# Patient Record
Sex: Female | Born: 1975 | Race: White | Hispanic: No | Marital: Single | Smoking: Never smoker
Health system: Southern US, Community
[De-identification: ages and names within clinical notes are randomized; demographics above are authoritative.]

## PROBLEM LIST (undated history)

## (undated) DIAGNOSIS — B019 Varicella without complication: Secondary | ICD-10-CM

## (undated) DIAGNOSIS — I10 Essential (primary) hypertension: Secondary | ICD-10-CM

## (undated) HISTORY — DX: Varicella without complication: B01.9

---
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
## 2011-07-20 LAB — HM PAP SMEAR

## 2012-08-10 ENCOUNTER — Other Ambulatory Visit: Payer: Self-pay | Admitting: Internal Medicine

## 2012-08-10 NOTE — Telephone Encounter (Signed)
Pt is needing refill on her Birth control. She uses [facility] on [address].

## 2012-08-11 NOTE — Telephone Encounter (Signed)
Yes, if taking regularly and not missed and no concern of pregnancy - ok to fill until appt.  Please document name of med

## 2012-08-11 NOTE — Telephone Encounter (Signed)
Called patient, she said that every thing was fine. Also, called pharmacy to see what med she is taking. Wanted to make sure you want to fill.

## 2012-08-11 NOTE — Telephone Encounter (Signed)
Confirm with pt - has she been taking regularly, missed any, any concerns regarding pregnancy?

## 2012-08-18 ENCOUNTER — Telehealth: Payer: Self-pay | Admitting: *Deleted

## 2012-08-19 NOTE — Telephone Encounter (Signed)
Opened in error

## 2012-10-19 ENCOUNTER — Other Ambulatory Visit (HOSPITAL_COMMUNITY)
Admission: RE | Admit: 2012-10-19 | Discharge: 2012-10-19 | Disposition: A | Discharge status: Home / Self Care | Payer: BC Managed Care – PPO | Source: Ambulatory Visit | Attending: Internal Medicine | Admitting: Internal Medicine

## 2012-10-19 ENCOUNTER — Encounter: Payer: Self-pay | Admitting: Internal Medicine

## 2012-10-19 ENCOUNTER — Telehealth: Payer: Self-pay | Admitting: Internal Medicine

## 2012-10-19 ENCOUNTER — Ambulatory Visit (INDEPENDENT_AMBULATORY_CARE_PROVIDER_SITE_OTHER): Payer: BC Managed Care – PPO | Admitting: Internal Medicine

## 2012-10-19 VITALS — BP 110/76 | HR 92 | Temp 98.8°F | Ht 61.75 in | Wt 128.5 lb

## 2012-10-19 DIAGNOSIS — Z0000 Encounter for general adult medical examination without abnormal findings: Secondary | ICD-10-CM

## 2012-10-19 DIAGNOSIS — Z01419 Encounter for gynecological examination (general) (routine) without abnormal findings: Secondary | ICD-10-CM | POA: Insufficient documentation

## 2012-10-19 DIAGNOSIS — Z139 Encounter for screening, unspecified: Secondary | ICD-10-CM

## 2012-10-19 DIAGNOSIS — E0789 Other specified disorders of thyroid: Secondary | ICD-10-CM

## 2012-10-19 DIAGNOSIS — Z1151 Encounter for screening for human papillomavirus (HPV): Secondary | ICD-10-CM | POA: Insufficient documentation

## 2012-10-19 LAB — CBC WITH DIFFERENTIAL/PLATELET
Basophils Relative: 0.7 % (ref 0.0–3.0)
Eosinophils Relative: 1.6 % (ref 0.0–5.0)
Hemoglobin: 13.3 g/dL (ref 12.0–15.0)
Lymphocytes Relative: 39.8 % (ref 12.0–46.0)
MCHC: 33.9 g/dL (ref 30.0–36.0)
Monocytes Relative: 7.7 % (ref 3.0–12.0)
Neutro Abs: 2.9 10*3/uL (ref 1.4–7.7)
Neutrophils Relative %: 50.2 % (ref 43.0–77.0)
RBC: 4.47 Mil/uL (ref 3.87–5.11)
WBC: 5.7 10*3/uL (ref 4.5–10.5)

## 2012-10-19 LAB — T3, FREE: T3, Free: 3.5 pg/mL (ref 2.3–4.2)

## 2012-10-19 LAB — T4, FREE: Free T4: 0.71 ng/dL (ref 0.60–1.60)

## 2012-10-19 MED ORDER — NORGESTIM-ETH ESTRAD TRIPHASIC 0.18/0.215/0.25 MG-35 MCG PO TABS: 1.0000 | ORAL_TABLET | Freq: Every day | ORAL | Status: DC

## 2012-10-19 NOTE — Telephone Encounter (Signed)
Pt notified of labs vial my chart

## 2012-10-20 ENCOUNTER — Ambulatory Visit: Payer: Self-pay | Admitting: Internal Medicine

## 2012-10-21 ENCOUNTER — Encounter: Payer: Self-pay | Admitting: Internal Medicine

## 2012-10-23 ENCOUNTER — Encounter: Payer: Self-pay | Admitting: Internal Medicine

## 2012-10-23 NOTE — Progress Notes (Signed)
  Subjective:    Patient ID: Stephanie Graves, female    DOB: 07/08/1976, 37 y.o.   MRN: [id number]  HPI 37 year old female who comes in for her physical exam.  States she is doing well.  No significant allergy symptoms.  Got her flu shot 07/27/12.  LMP 12/13.  Period varies.  Not sexually active currently.  Denies possibility of being pregnant.  She is off her OCPs.  Ran out.  Periods are regular when she takes the pills.  No nausea or vomiting.  No problems swallowing.  No vaginal symptoms.    Past Medical History  Diagnosis Date  . Chicken pox as a child    Current Outpatient Prescriptions on File Prior to Visit  Medication Sig Dispense Refill  . Norgestimate-Ethinyl Estradiol Triphasic (ORTHO TRI-CYCLEN, 28,) 0.18/0.215/0.25 MG-35 MCG tablet Take 1 tablet by mouth daily.  1 Package  12    Review of Systems Patient denies any headache, lightheadedness or dizziness.  No sinus or allergy symptoms.  No chest pain, tightness or palpitations.  No increased shortness of breath, cough or congestion.  No nausea or vomiting. No acid reflux.  No abdominal pain or cramping.  No bowel change, such as diarrhea, constipation, BRBPR or melana.  No urine change.  Periods as outlined.       Objective:   Physical Exam Filed Vitals:   10/19/12 0829  BP: 110/76  Pulse: 92  Temp: 98.8 F (74.65 C)   37 year old female in no acute distress.   HEENT:  Nares- clear.  Oropharynx - without lesions. NECK:  Supple.  Nontender.  No audible bruit. Fullness - thyroid.  Diffuse.   HEART:  Appears to be regular. LUNGS:  No crackles or wheezing audible.  Respirations even and unlabored.  RADIAL PULSE:  Equal bilaterally.    BREASTS:  No nipple discharge or nipple retraction present.  Could not appreciate any distinct nodules or axillary adenopathy.  ABDOMEN:  Soft, nontender.  Bowel sounds present and normal.  No audible abdominal bruit.  GU:  Normal external genitalia.  Vaginal vault without lesions.  Cervix  identified.  Pap performed. Could not appreciate any adnexal masses or tenderness.  EXTREMITIES:  No increased edema present.  DP pulses palpable and equal bilaterally.          Assessment & Plan:  THYROID FULLNESS.  Check thyroid function and thyroid ultrasound.  Further w/up pending results.    FATIGUE.  Did report some fatigue.  Overall doing well.  Check cbc, met c and tsh.   MENSTRUAL IRREGULARITY.  Off her OCPs.  Regular when she takes.  States she is not currently sexually active.  Will restart after her next period as directed.  Follow.    HEALTH MAINTENANCE.  Physical today.  Pelvic/pap today.  Schedule baseline mammogram.  Check cholesterol.

## 2012-10-26 ENCOUNTER — Ambulatory Visit: Payer: Self-pay | Admitting: Internal Medicine

## 2012-10-28 ENCOUNTER — Ambulatory Visit: Payer: Self-pay | Admitting: Internal Medicine

## 2012-10-31 ENCOUNTER — Encounter: Payer: Self-pay | Admitting: Internal Medicine

## 2012-11-01 ENCOUNTER — Encounter: Payer: Self-pay | Admitting: Internal Medicine

## 2012-11-01 ENCOUNTER — Telehealth: Payer: Self-pay | Admitting: General Practice

## 2012-11-01 NOTE — Telephone Encounter (Signed)
Pt was calling to see if any results came in for [place] and mammograms from last week? Please advise.

## 2012-11-01 NOTE — Telephone Encounter (Signed)
Called pt with results.  Please schedule her to return for a follow up breast exam in the next 4-6 weeks.  Thanks.

## 2012-11-02 NOTE — Telephone Encounter (Signed)
Appointment 2/28 @ 8:15 pt aware of appointment

## 2012-11-19 ENCOUNTER — Other Ambulatory Visit: Payer: Self-pay

## 2012-12-02 ENCOUNTER — Encounter: Payer: Self-pay | Admitting: Internal Medicine

## 2012-12-02 ENCOUNTER — Ambulatory Visit (INDEPENDENT_AMBULATORY_CARE_PROVIDER_SITE_OTHER): Payer: BC Managed Care – PPO | Admitting: Internal Medicine

## 2012-12-02 VITALS — BP 110/80 | HR 91 | Temp 98.5°F | Ht 61.75 in | Wt 127.8 lb

## 2012-12-02 DIAGNOSIS — R928 Other abnormal and inconclusive findings on diagnostic imaging of breast: Secondary | ICD-10-CM

## 2012-12-04 ENCOUNTER — Encounter: Payer: Self-pay | Admitting: Internal Medicine

## 2012-12-04 NOTE — Progress Notes (Signed)
  Subjective:    Patient ID: Stephanie Graves, female    DOB: April 23, 1976, 37 y.o.   MRN: [id number]  HPI 37 year old female who comes in today for a follow up breast exam.  States she is doing relatively well.  Got her flu shot 07/27/12.  Has had some cough recently.  Taking dayquil and nyquil.  Improved.  LMP 12/13.   Not sexually active currently.  Denies possibility of being pregnant.  Back on OCPs. Periods are regular when she takes the pills.  No nausea or vomiting.  No problems swallowing.  No vaginal symptoms.  Has not noticed any change in her breast.  No nipple discharge.   Past Medical History  Diagnosis Date  . Chicken pox as a child    Current Outpatient Prescriptions on File Prior to Visit  Medication Sig Dispense Refill  . ibuprofen (ADVIL,MOTRIN) 200 MG tablet Take 200 mg by mouth every 6 (six) hours as needed.      . Norgestimate-Ethinyl Estradiol Triphasic (ORTHO TRI-CYCLEN, 28,) 0.18/0.215/0.25 MG-35 MCG tablet Take 1 tablet by mouth daily.  1 Package  12   No current facility-administered medications on file prior to visit.    Review of Systems Patient denies any headache, lightheadedness or dizziness.  Some recent increased cough.  See above.  No chest pain, tightness or palpitations.  No increased shortness of breath.  No nausea or vomiting. No acid reflux.  No abdominal pain or cramping.  No bowel change, such as diarrhea, constipation, BRBPR or melana.  No urine change.  Periods as outlined.       Objective:   Physical Exam  Filed Vitals:   12/02/12 0812  BP: 110/80  Pulse: 91  Temp: 98.5 F (63.26 C)   37 year old female in no acute distress.   HEENT:  Nares- clear.  Oropharynx - without lesions. NECK:  Supple.  Nontender.  No audible bruit. Fullness - thyroid.  Diffuse.   HEART:  Appears to be regular. LUNGS:  No crackles or wheezing audible.  Respirations even and unlabored.  RADIAL PULSE:  Equal bilaterally.    BREASTS:  No nipple discharge or nipple  retraction present.  Could not appreciate any distinct nodules or axillary adenopathy.  ABDOMEN:  Soft, nontender.  Bowel sounds present and normal.  No audible abdominal bruit.   EXTREMITIES:  No increased edema present.  DP pulses palpable and equal bilaterally.          Assessment & Plan:  THYROID FULLNESS.  Had ultrasound.  See ultrasound report.  Referred to endocrinology.   ABNORMAL MAMMOGRAM.  See mammo report and ultrasound report.  Changes felt to be benign - BiRADS II.  Will repeat mammo in one year.  Pt comfortable with this plan.  Discussed referral and six month follow up.  Will repeat in one year.   HEALTH MAINTENANCE.  Physical last visit.  Had normal pap.  Mammogram as outlined.

## 2012-12-19 ENCOUNTER — Encounter: Payer: Self-pay | Admitting: Internal Medicine

## 2013-08-10 ENCOUNTER — Other Ambulatory Visit: Payer: Self-pay

## 2013-10-19 ENCOUNTER — Other Ambulatory Visit (HOSPITAL_COMMUNITY)
Admission: RE | Admit: 2013-10-19 | Discharge: 2013-10-19 | Disposition: A | Discharge status: Home / Self Care | Payer: BC Managed Care – PPO | Source: Ambulatory Visit | Attending: Internal Medicine | Admitting: Internal Medicine

## 2013-10-19 ENCOUNTER — Encounter: Payer: Self-pay | Admitting: Internal Medicine

## 2013-10-19 ENCOUNTER — Ambulatory Visit (INDEPENDENT_AMBULATORY_CARE_PROVIDER_SITE_OTHER): Payer: BC Managed Care – PPO | Admitting: Internal Medicine

## 2013-10-19 VITALS — BP 110/70 | HR 107 | Temp 98.6°F | Ht 61.75 in | Wt 131.5 lb

## 2013-10-19 DIAGNOSIS — E0789 Other specified disorders of thyroid: Secondary | ICD-10-CM

## 2013-10-19 DIAGNOSIS — Z1239 Encounter for other screening for malignant neoplasm of breast: Secondary | ICD-10-CM

## 2013-10-19 DIAGNOSIS — Z1151 Encounter for screening for human papillomavirus (HPV): Secondary | ICD-10-CM | POA: Insufficient documentation

## 2013-10-19 DIAGNOSIS — Z01419 Encounter for gynecological examination (general) (routine) without abnormal findings: Secondary | ICD-10-CM | POA: Insufficient documentation

## 2013-10-19 DIAGNOSIS — Z124 Encounter for screening for malignant neoplasm of cervix: Secondary | ICD-10-CM

## 2013-10-19 MED ORDER — NORGESTIM-ETH ESTRAD TRIPHASIC 0.18/0.215/0.25 MG-35 MCG PO TABS: 1.0000 | ORAL_TABLET | Freq: Every day | ORAL | Status: DC

## 2013-10-19 NOTE — Progress Notes (Signed)
  Subjective:    Patient ID: Stephanie GillisJessica Kinner, female    DOB: 03/04/1976, 38 y.o.   MRN: [id number]  HPI 38 year old female who comes in today for a complete physical exam.  States she is doing well.   LMP last week of December.   Not sexually active currently.  Denies possibility of being pregnant.  Taking OCPs regularly.  Periods are regular.  No nausea or vomiting.  No problems swallowing.  No vaginal symptoms.  Has not noticed any change in her breast.  No nipple discharge. Going to the gym.  Exercising.  No problems exercising.     Past Medical History  Diagnosis Date  . Chicken pox as a child    Current Outpatient Prescriptions on File Prior to Visit  Medication Sig Dispense Refill  . ibuprofen (ADVIL,MOTRIN) 200 MG tablet Take 200 mg by mouth every 6 (six) hours as needed.      . Norgestimate-Ethinyl Estradiol Triphasic (ORTHO TRI-CYCLEN, 28,) 0.18/0.215/0.25 MG-35 MCG tablet Take 1 tablet by mouth daily.  1 Package  12   No current facility-administered medications on file prior to visit.    Review of Systems Patient denies any headache, lightheadedness or dizziness.  No sinus or allergy symptoms.   No chest pain, tightness or palpitations.  No increased shortness of breath.  No nausea or vomiting. No acid reflux.  No abdominal pain or cramping.  No bowel change, such as diarrhea, constipation, BRBPR or melana.  No urine change.  Periods regular.        Objective:   Physical Exam  Filed Vitals:   10/19/13 0830  BP: 110/70  Pulse: 107  Temp: 98.6 F (37 C)   Pulse recheck:  73100  38 year old female in no acute distress.   HEENT:  Nares- clear.  Oropharynx - without lesions. NECK:  Supple.  Nontender.  No audible bruit.  HEART:  Appears to be regular. LUNGS:  No crackles or wheezing audible.  Respirations even and unlabored.  RADIAL PULSE:  Equal bilaterally.    BREASTS:  No nipple discharge or nipple retraction present.  Could not appreciate any distinct nodules or  axillary adenopathy.  ABDOMEN:  Soft, nontender.  Bowel sounds present and normal.  No audible abdominal bruit.  GU:  Normal external genitalia.  Vaginal vault without lesions.  Cervix identified.  Pap performed. Could not appreciate any adnexal masses or tenderness.   RECTAL:  Not performed.   EXTREMITIES:  No increased edema present.  DP pulses palpable and equal bilaterally.          Assessment & Plan:  THYROID FULLNESS.  Had ultrasound.  See ultrasound report.  Referred to endocrinology.  Stable.  No change.    ABNORMAL MAMMOGRAM.  See mammo report and ultrasound report.  Changes felt to be benign - BiRADS II.  Will repeat mammo this year.    HEALTH MAINTENANCE.  Physical today.  Pap today.  Mammogram as outlined.  Mammogram scheduled.    I spent 25 minutes with the patient and more than 50% of the time was spent in consultation regarding the above.

## 2013-10-19 NOTE — Progress Notes (Signed)
Pre-visit discussion using our clinic review tool. No additional management support is needed unless otherwise documented below in the visit note.  

## 2013-10-19 NOTE — Assessment & Plan Note (Signed)
Saw endocrinology.  Recommended f/u thyroid ultrasound in 1 year - due 7/15 with endo.  Can repeat tsh then.

## 2013-10-21 ENCOUNTER — Encounter: Payer: Self-pay | Admitting: Internal Medicine

## 2013-11-02 ENCOUNTER — Ambulatory Visit: Payer: Self-pay | Admitting: Internal Medicine

## 2013-11-02 LAB — HM MAMMOGRAPHY

## 2013-11-03 ENCOUNTER — Encounter: Payer: Self-pay | Admitting: Internal Medicine

## 2013-11-06 ENCOUNTER — Ambulatory Visit: Payer: Self-pay | Admitting: Internal Medicine

## 2013-11-06 LAB — HM MAMMOGRAPHY

## 2013-11-09 ENCOUNTER — Encounter: Payer: Self-pay | Admitting: Internal Medicine

## 2013-11-10 ENCOUNTER — Encounter: Payer: Self-pay | Admitting: *Deleted

## 2013-11-13 NOTE — Telephone Encounter (Signed)
Mailed unread message to pt, requested call back to schedule appt

## 2013-12-01 ENCOUNTER — Encounter: Payer: Self-pay | Admitting: Internal Medicine

## 2013-12-01 ENCOUNTER — Ambulatory Visit (INDEPENDENT_AMBULATORY_CARE_PROVIDER_SITE_OTHER): Payer: BC Managed Care – PPO | Admitting: Internal Medicine

## 2013-12-01 VITALS — BP 120/84 | HR 91 | Temp 98.5°F | Ht 61.75 in | Wt 132.5 lb

## 2013-12-01 DIAGNOSIS — R928 Other abnormal and inconclusive findings on diagnostic imaging of breast: Secondary | ICD-10-CM

## 2013-12-01 NOTE — Progress Notes (Signed)
  Subjective:    Patient ID: Stephanie Graves, female    DOB: 04/25/1976, 38 y.o.   MRN: [id number]  HPI 38 year old female who comes in today as a work in for a f/u breast exam.  States she is doing well.   LMP finishing now. Taking OCPs regularly.  Periods are regular.  No nausea or vomiting.  No problems swallowing.  No vaginal symptoms.  Has not noticed any change in her breast.  No nipple discharge. Going to the gym.  Exercising.  No problems exercising.     Past Medical History  Diagnosis Date  . Chicken pox as a child    Current Outpatient Prescriptions on File Prior to Visit  Medication Sig Dispense Refill  . ibuprofen (ADVIL,MOTRIN) 200 MG tablet Take 200 mg by mouth every 6 (six) hours as needed.      . Norgestimate-Ethinyl Estradiol Triphasic (ORTHO TRI-CYCLEN, 28,) 0.18/0.215/0.25 MG-35 MCG tablet Take 1 tablet by mouth daily.  1 Package  12   No current facility-administered medications on file prior to visit.    Review of Systems No sinus or allergy symptoms.   No increased shortness of breath.  No cough or congestion.  No nausea or vomiting. No acid reflux.  No abdominal pain or cramping.  No bowel change, such as diarrhea, constipation, BRBPR or melana.  No urine change.  Periods regular.        Objective:   Physical Exam  Filed Vitals:   12/01/13 1106  BP: 120/84  Pulse: 91  Temp: 98.5 F (4436.799 C)   38 year old female in no acute distress. NECK:  Supple.  Nontender.  No audible bruit.  HEART:  Appears to be regular. LUNGS:  No crackles or wheezing audible.  Respirations even and unlabored.  RADIAL PULSE:  Equal bilaterally.    BREASTS:  No nipple discharge or nipple retraction present.  Could not appreciate any distinct nodules or axillary adenopathy.  ABDOMEN:  Soft, nontender.  Bowel sounds present and normal.  No audible abdominal bruit.         Assessment & Plan:  THYROID FULLNESS.  Had ultrasound.  See ultrasound report.  Referred to endocrinology.   Stable.  No change.    ABNORMAL MAMMOGRAM.  See mammo report and ultrasound report.  Changes felt to be benign - BiRADS III.  Breast exam as outlined.  Plan for f/u left breast ultrasound in 6 months.  Pt comfortable with this plan.    HEALTH MAINTENANCE.  Physical last visit.  Pap at physical ok.  Mammogram as outlined.  Follow up breast ultrasound planned.

## 2013-12-02 ENCOUNTER — Encounter: Payer: Self-pay | Admitting: Internal Medicine

## 2013-12-18 ENCOUNTER — Encounter: Payer: Self-pay | Admitting: Internal Medicine

## 2013-12-20 ENCOUNTER — Encounter: Payer: Self-pay | Admitting: Internal Medicine

## 2014-05-01 DIAGNOSIS — E041 Nontoxic single thyroid nodule: Secondary | ICD-10-CM | POA: Insufficient documentation

## 2014-05-23 ENCOUNTER — Telehealth: Payer: Self-pay | Admitting: Internal Medicine

## 2014-05-23 NOTE — Telephone Encounter (Addendum)
[facility] Left breast Ultrasound follow up cyst.  9.3.15 @ 9:40  Left the patient a voice mail on her cell phone in reference to her appointment . 8.19.15

## 2014-05-25 NOTE — Telephone Encounter (Signed)
The patient is aware of her Ultrasound appointment with [facility] on 9.3.15 @ 9:00.

## 2014-06-07 ENCOUNTER — Ambulatory Visit: Payer: Self-pay | Admitting: Internal Medicine

## 2014-06-07 LAB — HM MAMMOGRAPHY

## 2014-06-08 ENCOUNTER — Encounter: Payer: Self-pay | Admitting: Internal Medicine

## 2014-06-08 DIAGNOSIS — R928 Other abnormal and inconclusive findings on diagnostic imaging of breast: Secondary | ICD-10-CM | POA: Insufficient documentation

## 2014-08-10 ENCOUNTER — Encounter: Payer: Self-pay | Admitting: Podiatry

## 2014-08-10 ENCOUNTER — Ambulatory Visit (INDEPENDENT_AMBULATORY_CARE_PROVIDER_SITE_OTHER): Payer: BC Managed Care – PPO | Admitting: Podiatry

## 2014-08-10 ENCOUNTER — Ambulatory Visit (INDEPENDENT_AMBULATORY_CARE_PROVIDER_SITE_OTHER): Payer: BC Managed Care – PPO

## 2014-08-10 VITALS — BP 125/84 | HR 91 | Resp 16 | Ht 62.0 in | Wt 122.0 lb

## 2014-08-10 DIAGNOSIS — B079 Viral wart, unspecified: Secondary | ICD-10-CM

## 2014-08-10 DIAGNOSIS — L923 Foreign body granuloma of the skin and subcutaneous tissue: Secondary | ICD-10-CM

## 2014-08-10 DIAGNOSIS — B078 Other viral warts: Secondary | ICD-10-CM

## 2014-08-10 NOTE — Progress Notes (Signed)
   Subjective:    Patient ID: Stephanie Graves, female    DOB: 1975/11/06, 38 y.o.   MRN: [id number]  HPI Comments: i have a spot on the bottom of my left foot. It can be painful depending on how much i walk. Its been like this for 1.5 month. Its remained the same. Pressure will bother it. i have done nothing for my foot.   Foot Pain      Review of Systems  All other systems reviewed and are negative.      Objective:   Physical Exam        Assessment & Plan:

## 2014-08-10 NOTE — Patient Instructions (Signed)

## 2014-08-10 NOTE — Progress Notes (Signed)
Subjective:     Patient ID: Stephanie Graves, female   DOB: 07-05-1976, 38 y.o.   MRN: [id number]  HPI patient states I have a painful spot underneath my left foot that is really bothering me and making it hard for me to walk. It's been present for several months and I'm not sure why it's there   Review of Systems  All other systems reviewed and are negative.      Objective:   Physical Exam  Constitutional: She is oriented to person, place, and time.  Cardiovascular: Intact distal pulses.   Musculoskeletal: Normal range of motion.  Neurological: She is oriented to person, place, and time.  Skin: Skin is warm.  Nursing note and vitals reviewed.  patient's found to have a lesion underneath the first metatarsal head left in the area of the previous fibular sesamoidectomy that I performed that upon debridement shows pinpoint bleeding and pain to lateral pressure. 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 I recommended excision of mass and explained the surgery to patient and she wants this done understanding it may recur or other complications that can occur. Patient was infiltrated with 60 mg Xylocaine with epinephrine today and after appropriate numbness we applied sterile prep and I then excised the lesion completely and sent in formalin for pathological evaluation. Applied sterile dressing and instructed on elevation and ibuprofen usage

## 2014-09-10 ENCOUNTER — Encounter: Payer: Self-pay | Admitting: Podiatry

## 2014-10-22 ENCOUNTER — Ambulatory Visit (INDEPENDENT_AMBULATORY_CARE_PROVIDER_SITE_OTHER): Payer: BC Managed Care – PPO | Admitting: Internal Medicine

## 2014-10-22 ENCOUNTER — Encounter: Payer: Self-pay | Admitting: Internal Medicine

## 2014-10-22 VITALS — BP 124/83 | HR 85 | Temp 97.9°F | Ht 61.75 in | Wt 121.5 lb

## 2014-10-22 DIAGNOSIS — R928 Other abnormal and inconclusive findings on diagnostic imaging of breast: Secondary | ICD-10-CM

## 2014-10-22 DIAGNOSIS — E0789 Other specified disorders of thyroid: Secondary | ICD-10-CM

## 2014-10-22 DIAGNOSIS — Z0000 Encounter for general adult medical examination without abnormal findings: Secondary | ICD-10-CM

## 2014-10-22 DIAGNOSIS — Z13220 Encounter for screening for lipoid disorders: Secondary | ICD-10-CM

## 2014-10-22 MED ORDER — NORGESTIM-ETH ESTRAD TRIPHASIC 0.18/0.215/0.25 MG-35 MCG PO TABS: 1.0000 | ORAL_TABLET | Freq: Every day | ORAL | Status: DC

## 2014-10-22 NOTE — Progress Notes (Signed)
Pre visit review using our clinic review tool, if applicable. No additional management support is needed unless otherwise documented below in the visit note. 

## 2014-10-26 ENCOUNTER — Encounter: Payer: Self-pay | Admitting: Internal Medicine

## 2014-10-26 NOTE — Progress Notes (Signed)
  Subjective:    Patient ID: Stephanie Graves, female    DOB: [birth year], 39 y.o.   MRN: [id number]  HPI 39 year old female who comes in today for her physical exam.   States she is doing well.   LMP a few weeks ago.  Due to start next week.  Taking OCPs regularly.  Periods are regular.  No nausea or vomiting.  No problems swallowing.  No vaginal symptoms.  Has not noticed any change in her breast.  No nipple discharge.  Due for f/u mammogram and ultrasound.  Increased stress with work.  Handling well.  She is now living with her parents.  Helping take care of them.      Past Medical History  Diagnosis Date  . Chicken pox as a child    Current Outpatient Prescriptions on File Prior to Visit  Medication Sig Dispense Refill  . ibuprofen (ADVIL,MOTRIN) 200 MG tablet Take 200 mg by mouth every 6 (six) hours as needed.     No current facility-administered medications on file prior to visit.    Review of Systems No sinus or allergy symptoms.   No increased shortness of breath.  No cough or congestion.  No nausea or vomiting. No acid reflux.  No abdominal pain or cramping.  No bowel change, such as diarrhea, constipation, BRBPR or melana.  No urine change.  Periods regular.  Overalls he feels she is doing well.         Objective:   Physical Exam  Filed Vitals:   10/22/14 1537  BP: 124/83  Pulse: 85  Temp: 97.9 F (6236.436 C)   39 year old female in no acute distress.   HEENT:  Nares- clear.  Oropharynx - without lesions. NECK:  Supple.  Nontender.  No audible bruit.  HEART:  Appears to be regular. LUNGS:  No crackles or wheezing audible.  Respirations even and unlabored.  RADIAL PULSE:  Equal bilaterally.    BREASTS:  No nipple discharge or nipple retraction present.  Could not appreciate any distinct nodules or axillary adenopathy.  ABDOMEN:  Soft, nontender.  Bowel sounds present and normal.  No audible abdominal bruit.  GU:  Not performed.     EXTREMITIES:  No increased edema present.   DP pulses palpable and equal bilaterally.          Assessment & Plan:  Abnormal mammogram Breast exam as outlined.  Due bilateral mammogram and left breast ultrasound.   - MM Digital Diagnostic Bilat; Future - US BREAST COMPLETE UNI LEFT INC AXILLA; Future  Thyroid fullness Had thyroid ultrasound as outlined.   - CBC with Differential; Future - Comprehensive metabolic panel; Future - TSH; Future  Screening cholesterol level - Lipid panel; Future  THYROID FULLNESS.  Had ultrasound.  See ultrasound report.  Referred to endocrinology.  Stable.  No change.  Check tsh today.   ABNORMAL MAMMOGRAM.  See mammo report and ultrasound report.  Changes felt to be benign - BiRADS III.  Breast exam as outlined.  Left breast mammogram and ultrasound 06/07/14 - birads III. Due for bilateral mammogram and left breast ultrasound.    HEALTH MAINTENANCE.  Physical today.  Pap 10/19/13 - negative with negative HPV.  Mammogram as outlined.

## 2014-11-05 ENCOUNTER — Other Ambulatory Visit: Payer: Self-pay

## 2014-11-05 ENCOUNTER — Encounter: Payer: Self-pay | Admitting: Internal Medicine

## 2014-11-05 ENCOUNTER — Telehealth: Payer: Self-pay | Admitting: *Deleted

## 2014-11-05 ENCOUNTER — Ambulatory Visit: Payer: Self-pay | Admitting: Internal Medicine

## 2014-11-05 ENCOUNTER — Other Ambulatory Visit (INDEPENDENT_AMBULATORY_CARE_PROVIDER_SITE_OTHER): Payer: BC Managed Care – PPO

## 2014-11-05 DIAGNOSIS — E0789 Other specified disorders of thyroid: Secondary | ICD-10-CM

## 2014-11-05 DIAGNOSIS — R928 Other abnormal and inconclusive findings on diagnostic imaging of breast: Secondary | ICD-10-CM

## 2014-11-05 DIAGNOSIS — Z13220 Encounter for screening for lipoid disorders: Secondary | ICD-10-CM

## 2014-11-05 LAB — CBC WITH DIFFERENTIAL/PLATELET
BASOS PCT: 0.4 % (ref 0.0–3.0)
Basophils Absolute: 0 10*3/uL (ref 0.0–0.1)
Eosinophils Absolute: 0 10*3/uL (ref 0.0–0.7)
Eosinophils Relative: 0.6 % (ref 0.0–5.0)
HEMATOCRIT: 39.8 % (ref 36.0–46.0)
Hemoglobin: 13.7 g/dL (ref 12.0–15.0)
Lymphocytes Relative: 26.8 % (ref 12.0–46.0)
Lymphs Abs: 2 10*3/uL (ref 0.7–4.0)
MCHC: 34.4 g/dL (ref 30.0–36.0)
MCV: 86.3 fl (ref 78.0–100.0)
Monocytes Absolute: 0.4 10*3/uL (ref 0.1–1.0)
Monocytes Relative: 5.3 % (ref 3.0–12.0)
NEUTROS PCT: 66.9 % (ref 43.0–77.0)
Neutro Abs: 4.9 10*3/uL (ref 1.4–7.7)
Platelets: 310 10*3/uL (ref 150.0–400.0)
RBC: 4.6 Mil/uL (ref 3.87–5.11)
RDW: 12.9 % (ref 11.5–15.5)
WBC: 7.4 10*3/uL (ref 4.0–10.5)

## 2014-11-05 LAB — COMPREHENSIVE METABOLIC PANEL
ALT: 10 U/L (ref 0–35)
AST: 13 U/L (ref 0–37)
Albumin: 4 g/dL (ref 3.5–5.2)
Alkaline Phosphatase: 57 U/L (ref 39–117)
BUN: 18 mg/dL (ref 6–23)
CO2: 26 mEq/L (ref 19–32)
Calcium: 8.8 mg/dL (ref 8.4–10.5)
Chloride: 105 mEq/L (ref 96–112)
Creatinine, Ser: 0.88 mg/dL (ref 0.40–1.20)
GFR: 76.51 mL/min (ref >60.00)
GLUCOSE: 88 mg/dL (ref 70–99)
Potassium: 3.9 mEq/L (ref 3.5–5.1)
Sodium: 138 mEq/L (ref 135–145)
Total Bilirubin: 0.3 mg/dL (ref 0.2–1.2)
Total Protein: 6.7 g/dL (ref 6.0–8.3)

## 2014-11-05 LAB — LIPID PANEL
CHOL/HDL RATIO: 4
Cholesterol: 206 mg/dL — ABNORMAL HIGH (ref 0–200)
HDL: 55.5 mg/dL (ref >39.00)
LDL Cholesterol: 127 mg/dL — ABNORMAL HIGH (ref 0–99)
NonHDL: 150.5
Triglycerides: 116 mg/dL (ref 0.0–149.0)
VLDL: 23.2 mg/dL (ref 0.0–40.0)

## 2014-11-05 LAB — HM MAMMOGRAPHY

## 2014-11-05 LAB — TSH: TSH: 2.9 u[IU]/mL (ref 0.35–4.50)

## 2014-11-05 NOTE — Telephone Encounter (Signed)
[facility] states pt is present for Diagnostic Mammogram to f/u on Left Breast.  Requesting an order for a Right breast U/S as per requested by radiologist.  Requesting order be faxed to [phone], given verbal, need written as well.

## 2014-11-05 NOTE — Telephone Encounter (Signed)
Order faxed.

## 2014-11-05 NOTE — Telephone Encounter (Signed)
Ultrasound ordered

## 2014-11-06 ENCOUNTER — Encounter: Payer: Self-pay | Admitting: Internal Medicine

## 2015-10-24 ENCOUNTER — Ambulatory Visit (INDEPENDENT_AMBULATORY_CARE_PROVIDER_SITE_OTHER): Payer: BC Managed Care – PPO | Admitting: Internal Medicine

## 2015-10-24 ENCOUNTER — Encounter: Payer: Self-pay | Admitting: Internal Medicine

## 2015-10-24 VITALS — BP 110/80 | HR 89 | Temp 98.0°F | Resp 18 | Ht 61.75 in | Wt 130.5 lb

## 2015-10-24 DIAGNOSIS — R928 Other abnormal and inconclusive findings on diagnostic imaging of breast: Secondary | ICD-10-CM | POA: Diagnosis not present

## 2015-10-24 DIAGNOSIS — Z13220 Encounter for screening for lipoid disorders: Secondary | ICD-10-CM

## 2015-10-24 DIAGNOSIS — Z0000 Encounter for general adult medical examination without abnormal findings: Secondary | ICD-10-CM

## 2015-10-24 DIAGNOSIS — E0789 Other specified disorders of thyroid: Secondary | ICD-10-CM | POA: Diagnosis not present

## 2015-10-24 MED ORDER — NORGESTIM-ETH ESTRAD TRIPHASIC 0.18/0.215/0.25 MG-35 MCG PO TABS: 1.0000 | ORAL_TABLET | Freq: Every day | ORAL | Status: DC

## 2015-10-24 NOTE — Progress Notes (Signed)
Patient ID: Dai Apel, female   DOB: 1976-09-03, 40 y.o.   MRN: [id number]   Subjective:    Patient ID: Danyah Guastella, female    DOB: Feb 17, 1976, 40 y.o.   MRN: [id number]  HPI  Patient her for her physical exam.  She feels she is doing well.  Stays active.  No cardiac symptoms with increased activity or exertion.  No sob.  No acid reflux.  Started running.  Exercising.  LMP last week.  Regular periods.  No abdominal pain or cramping.  Bowels stable.     Past Medical History  Diagnosis Date  . Chicken pox as a child   Past Surgical History  Procedure Laterality Date  . Foot surgery  1995    bone removed from left foot   Family History  Problem Relation Age of Onset  . Hypertension Mother   . Hypercholesterolemia Mother   . Stroke Maternal Grandfather   . Brain cancer Paternal Grandfather   . Breast cancer Neg Hx   . Colon cancer Neg Hx    Social History   Social History  . Marital Status: Single    Spouse Name: N/A  . Number of Children: 0  . Years of Education: N/A   Social History Main Topics  . Smoking status: Never Smoker   . Smokeless tobacco: Never Used  . Alcohol Use: 0.0 oz/week    0 Standard drinks or equivalent per week     Comment: occasional  . Drug Use: No  . Sexual Activity: Not Asked   Other Topics Concern  . None   Social History Narrative    Outpatient Encounter Prescriptions as of 10/24/2015  Medication Sig  . ibuprofen (ADVIL,MOTRIN) 200 MG tablet Take 200 mg by mouth every 6 (six) hours as needed.  . Norgestimate-Ethinyl Estradiol Triphasic (ORTHO TRI-CYCLEN, 28,) 0.18/0.215/0.25 MG-35 MCG tablet Take 1 tablet by mouth daily.  . [DISCONTINUED] Norgestimate-Ethinyl Estradiol Triphasic (ORTHO TRI-CYCLEN, 28,) 0.18/0.215/0.25 MG-35 MCG tablet Take 1 tablet by mouth daily.   No facility-administered encounter medications on file as of 10/24/2015.    Review of Systems  Constitutional: Negative for fever and appetite change.  HENT: Negative  for congestion and sinus pressure.   Eyes: Negative for pain and visual disturbance.  Respiratory: Negative for cough, chest tightness and shortness of breath.   Cardiovascular: Negative for chest pain, palpitations and leg swelling.  Gastrointestinal: Negative for nausea, vomiting, abdominal pain and diarrhea.  Genitourinary: Negative for dysuria and difficulty urinating.  Musculoskeletal: Negative for back pain and joint swelling.  Skin: Negative for color change and rash.  Neurological: Negative for dizziness, light-headedness and headaches.  Hematological: Negative for adenopathy. Does not bruise/bleed easily.  Psychiatric/Behavioral: Negative for dysphoric mood and agitation.       Objective:    Physical Exam  Constitutional: She is oriented to person, place, and time. She appears well-developed and well-nourished. No distress.  HENT:  Nose: Nose normal.  Mouth/Throat: Oropharynx is clear and moist.  Eyes: Right eye exhibits no discharge. Left eye exhibits no discharge. No scleral icterus.  Neck: Neck supple. No thyromegaly present.  Cardiovascular: Normal rate and regular rhythm.   Pulmonary/Chest: Breath sounds normal. No accessory muscle usage. No tachypnea. No respiratory distress. She has no decreased breath sounds. She has no wheezes. She has no rhonchi. Right breast exhibits no inverted nipple, no mass, no nipple discharge and no tenderness (no axillary adenopathy). Left breast exhibits no inverted nipple, no mass, no nipple discharge and no tenderness (no  axilarry adenopathy).  Abdominal: Soft. Bowel sounds are normal. There is no tenderness.  Musculoskeletal: She exhibits no edema or tenderness.  Lymphadenopathy:    She has no cervical adenopathy.  Neurological: She is alert and oriented to person, place, and time.  Skin: Skin is warm. No rash noted. No erythema.  Psychiatric: She has a normal mood and affect. Her behavior is normal.    BP 110/80 mmHg  Pulse 89   Temp(Src) 98 F (36.7 C) (Oral)  Resp 18  Ht 5' 1.75" (1.568 m)  Wt 130 lb 8 oz (59.194 kg)  BMI 24.08 kg/m2  SpO2 96%  LMP 10/15/2015 (Exact Date) Wt Readings from Last 3 Encounters:  10/24/15 130 lb 8 oz (59.194 kg)  10/22/14 121 lb 8 oz (55.112 kg)  08/10/14 122 lb (55.339 kg)     Lab Results  Component Value Date   WBC 7.4 11/05/2014   HGB 13.7 11/05/2014   HCT 39.8 11/05/2014   PLT 310.0 11/05/2014   GLUCOSE 88 11/05/2014   CHOL 206* 11/05/2014   TRIG 116.0 11/05/2014   HDL 55.50 11/05/2014   LDLCALC 127* 11/05/2014   ALT 10 11/05/2014   AST 13 11/05/2014   NA 138 11/05/2014   K 3.9 11/05/2014   CL 105 11/05/2014   CREATININE 0.88 11/05/2014   BUN 18 11/05/2014   CO2 26 11/05/2014   TSH 2.90 11/05/2014       Assessment & Plan:   Problem List Items Addressed This Visit    Abnormal mammogram    Diagnostic mammogram 11/05/14 - Birads III.  Recommended f/u in 05/2015.  Pt was aware,but did not have mammogram.  Will schedule.        Relevant Orders   MM Digital Diagnostic Bilat   US BREAST LTD UNI LEFT INC AXILLA   US BREAST LTD UNI RIGHT INC AXILLA   Health care maintenance    Physical today 10/25/15.  PAP 2015 - negative with negative HPV.        Thyroid fullness    Saw endocrinology.  They were following.  Check tsh.        Relevant Orders   CBC with Differential/Platelet   Comprehensive metabolic panel   TSH    Other Visit Diagnoses    Routine general medical examination at a health care facility    -  Primary    Screening cholesterol level        Relevant Orders    Lipid panel        Dale Pillager, MD

## 2015-10-24 NOTE — Progress Notes (Signed)
Pre-visit discussion using our clinic review tool. No additional management support is needed unless otherwise documented below in the visit note.  

## 2015-10-27 ENCOUNTER — Encounter: Payer: Self-pay | Admitting: Internal Medicine

## 2015-10-27 DIAGNOSIS — Z0000 Encounter for general adult medical examination without abnormal findings: Secondary | ICD-10-CM | POA: Insufficient documentation

## 2015-10-27 NOTE — Assessment & Plan Note (Signed)
Physical today 10/25/15.  PAP 2015 - negative with negative HPV.

## 2015-10-27 NOTE — Assessment & Plan Note (Signed)
Diagnostic mammogram 11/05/14 - [facility].  Recommended f/u in 05/2015.  Pt was aware,but did not have mammogram.  Will schedule.

## 2015-10-27 NOTE — Assessment & Plan Note (Signed)
Saw endocrinology.  They were following.  Check tsh.

## 2015-11-07 ENCOUNTER — Other Ambulatory Visit (INDEPENDENT_AMBULATORY_CARE_PROVIDER_SITE_OTHER): Payer: BC Managed Care – PPO

## 2015-11-07 DIAGNOSIS — E0789 Other specified disorders of thyroid: Secondary | ICD-10-CM

## 2015-11-07 DIAGNOSIS — Z13220 Encounter for screening for lipoid disorders: Secondary | ICD-10-CM

## 2015-11-07 LAB — COMPREHENSIVE METABOLIC PANEL
ALBUMIN: 4 g/dL (ref 3.5–5.2)
ALT: 10 U/L (ref 0–35)
AST: 11 U/L (ref 0–37)
Alkaline Phosphatase: 51 U/L (ref 39–117)
BUN: 15 mg/dL (ref 6–23)
CHLORIDE: 105 meq/L (ref 96–112)
CO2: 26 mEq/L (ref 19–32)
Calcium: 9 mg/dL (ref 8.4–10.5)
Creatinine, Ser: 0.78 mg/dL (ref 0.40–1.20)
GFR: 87.48 mL/min (ref >60.00)
Glucose, Bld: 97 mg/dL (ref 70–99)
POTASSIUM: 4 meq/L (ref 3.5–5.1)
SODIUM: 138 meq/L (ref 135–145)
Total Bilirubin: 0.3 mg/dL (ref 0.2–1.2)
Total Protein: 7.1 g/dL (ref 6.0–8.3)

## 2015-11-07 LAB — CBC WITH DIFFERENTIAL/PLATELET
BASOS PCT: 0.3 % (ref 0.0–3.0)
Basophils Absolute: 0 10*3/uL (ref 0.0–0.1)
EOS PCT: 0.4 % (ref 0.0–5.0)
Eosinophils Absolute: 0 10*3/uL (ref 0.0–0.7)
HCT: 39.5 % (ref 36.0–46.0)
HEMOGLOBIN: 13.4 g/dL (ref 12.0–15.0)
LYMPHS ABS: 2.2 10*3/uL (ref 0.7–4.0)
LYMPHS PCT: 24.1 % (ref 12.0–46.0)
MCHC: 34 g/dL (ref 30.0–36.0)
MCV: 87.9 fl (ref 78.0–100.0)
MONO ABS: 0.5 10*3/uL (ref 0.1–1.0)
MONOS PCT: 5 % (ref 3.0–12.0)
NEUTROS ABS: 6.5 10*3/uL (ref 1.4–7.7)
NEUTROS PCT: 70.2 % (ref 43.0–77.0)
PLATELETS: 323 10*3/uL (ref 150.0–400.0)
RBC: 4.49 Mil/uL (ref 3.87–5.11)
RDW: 13.1 % (ref 11.5–15.5)
WBC: 9.2 10*3/uL (ref 4.0–10.5)

## 2015-11-07 LAB — LIPID PANEL
CHOL/HDL RATIO: 3
Cholesterol: 179 mg/dL (ref 0–200)
HDL: 61 mg/dL (ref >39.00)
LDL Cholesterol: 98 mg/dL (ref 0–99)
NONHDL: 118.22
Triglycerides: 103 mg/dL (ref 0.0–149.0)
VLDL: 20.6 mg/dL (ref 0.0–40.0)

## 2015-11-07 LAB — TSH: TSH: 1.82 u[IU]/mL (ref 0.35–4.50)

## 2015-11-08 ENCOUNTER — Encounter: Payer: Self-pay | Admitting: Internal Medicine

## 2015-11-12 ENCOUNTER — Encounter: Payer: Self-pay | Admitting: Internal Medicine

## 2015-11-12 ENCOUNTER — Ambulatory Visit
Admission: RE | Admit: 2015-11-12 | Discharge: 2015-11-12 | Disposition: A | Discharge status: Home / Self Care | Payer: BC Managed Care – PPO | Source: Ambulatory Visit | Attending: Internal Medicine | Admitting: Internal Medicine

## 2015-11-12 ENCOUNTER — Other Ambulatory Visit: Payer: Self-pay | Admitting: Internal Medicine

## 2015-11-12 DIAGNOSIS — R928 Other abnormal and inconclusive findings on diagnostic imaging of breast: Secondary | ICD-10-CM | POA: Insufficient documentation

## 2015-11-12 DIAGNOSIS — N63 Unspecified lump in breast: Secondary | ICD-10-CM | POA: Diagnosis not present

## 2015-11-12 DIAGNOSIS — N6002 Solitary cyst of left breast: Secondary | ICD-10-CM | POA: Diagnosis not present

## 2015-11-12 DIAGNOSIS — N6001 Solitary cyst of right breast: Secondary | ICD-10-CM | POA: Insufficient documentation

## 2015-11-12 NOTE — Telephone Encounter (Signed)
Unread mychart message mailed to patient 

## 2016-04-08 DIAGNOSIS — H5213 Myopia, bilateral: Secondary | ICD-10-CM | POA: Diagnosis not present

## 2016-04-27 ENCOUNTER — Ambulatory Visit: Payer: Self-pay | Admitting: Internal Medicine

## 2016-05-05 ENCOUNTER — Ambulatory Visit (INDEPENDENT_AMBULATORY_CARE_PROVIDER_SITE_OTHER): Payer: BLUE CROSS/BLUE SHIELD | Admitting: Internal Medicine

## 2016-05-05 ENCOUNTER — Encounter: Payer: Self-pay | Admitting: Internal Medicine

## 2016-05-05 DIAGNOSIS — E0789 Other specified disorders of thyroid: Secondary | ICD-10-CM

## 2016-05-05 DIAGNOSIS — Z658 Other specified problems related to psychosocial circumstances: Secondary | ICD-10-CM

## 2016-05-05 DIAGNOSIS — R928 Other abnormal and inconclusive findings on diagnostic imaging of breast: Secondary | ICD-10-CM | POA: Diagnosis not present

## 2016-05-05 DIAGNOSIS — F439 Reaction to severe stress, unspecified: Secondary | ICD-10-CM

## 2016-05-05 NOTE — Progress Notes (Signed)
Pre-visit discussion using our clinic review tool. No additional management support is needed unless otherwise documented below in the visit note.  

## 2016-05-05 NOTE — Progress Notes (Signed)
Patient ID: Stephanie Graves, female   DOB: Apr 08, 1976, 40 y.o.   MRN: [id number]   Subjective:    Patient ID: Stephanie Graves, female    DOB: [birth year], 40 y.o.   MRN: [id number]  HPI  Patient here for a scheduled follow up.  She is under increased stress with her job.  Discussed with her today. Does not feel she needs anything more at this time.  Tries to stay active.  Not exercising.  Has been out of the routine with increased work hours.  No chest pain.  No sob.  No acid reflux.  No abdominal pain or cramping.  Bowels stable.     Past Medical History:  Diagnosis Date  . Chicken pox as a child   Past Surgical History:  Procedure Laterality Date  . FOOT SURGERY  1995   bone removed from left foot   Family History  Problem Relation Age of Onset  . Hypertension Mother   . Hypercholesterolemia Mother   . Stroke Maternal Grandfather   . Brain cancer Paternal Grandfather   . Breast cancer Neg Hx   . Colon cancer Neg Hx    Social History   Social History  . Marital status: Single    Spouse name: N/A  . Number of children: 0  . Years of education: N/A   Social History Main Topics  . Smoking status: Never Smoker  . Smokeless tobacco: Never Used  . Alcohol use 0.0 oz/week     Comment: occasional  . Drug use: No  . Sexual activity: Not Asked   Other Topics Concern  . None   Social History Narrative  . None    Outpatient Encounter Prescriptions as of 05/05/2016  Medication Sig  . ibuprofen (ADVIL,MOTRIN) 200 MG tablet Take 200 mg by mouth every 6 (six) hours as needed.  . Norgestimate-Ethinyl Estradiol Triphasic (ORTHO TRI-CYCLEN, 28,) 0.18/0.215/0.25 MG-35 MCG tablet Take 1 tablet by mouth daily.  . TRI-PREVIFEM 0.18/0.215/0.25 MG-35 MCG tablet TAKE 1 TABLET BY MOUTH DAILY.   No facility-administered encounter medications on file as of 05/05/2016.     Review of Systems  Constitutional: Negative for activity change and unexpected weight change.  HENT: Negative for  congestion and sinus pressure.   Respiratory: Negative for cough, chest tightness and shortness of breath.   Cardiovascular: Negative for chest pain, palpitations and leg swelling.  Gastrointestinal: Negative for abdominal pain, diarrhea, nausea and vomiting.  Genitourinary: Negative for difficulty urinating and dysuria.  Musculoskeletal: Negative for back pain and joint swelling.  Skin: Negative for color change and rash.  Neurological: Negative for dizziness, light-headedness and headaches.  Psychiatric/Behavioral: Negative for agitation and dysphoric mood.       Objective:     Blood pressure rechecked by me:  132/80  Physical Exam  Constitutional: She appears well-developed and well-nourished. No distress.  HENT:  Nose: Nose normal.  Mouth/Throat: Oropharynx is clear and moist.  Neck: Neck supple. No thyromegaly present.  Cardiovascular: Normal rate and regular rhythm.   Pulmonary/Chest: Breath sounds normal. No respiratory distress. She has no wheezes.  Abdominal: Soft. Bowel sounds are normal. There is no tenderness.  Musculoskeletal: She exhibits no edema or tenderness.  Lymphadenopathy:    She has no cervical adenopathy.  Skin: No rash noted. No erythema.  Psychiatric: She has a normal mood and affect. Her behavior is normal.    BP 140/80 (BP Location: Left Arm, Patient Position: Sitting, Cuff Size: Normal)   Pulse 98   Temp 98.3 F (36.8  C) (Oral)   Resp 17   Ht 5' 1.75" (1.568 m)   Wt 134 lb 6 oz (61 kg)   SpO2 98%   BMI 24.78 kg/m  Wt Readings from Last 3 Encounters:  05/05/16 134 lb 6 oz (61 kg)  10/24/15 130 lb 8 oz (59.2 kg)  10/22/14 121 lb 8 oz (55.1 kg)     Lab Results  Component Value Date   WBC 9.2 11/07/2015   HGB 13.4 11/07/2015   HCT 39.5 11/07/2015   PLT 323.0 11/07/2015   GLUCOSE 97 11/07/2015   CHOL 179 11/07/2015   TRIG 103.0 11/07/2015   HDL 61.00 11/07/2015   LDLCALC 98 11/07/2015   ALT 10 11/07/2015   AST 11 11/07/2015   NA 138  11/07/2015   K 4.0 11/07/2015   CL 105 11/07/2015   CREATININE 0.78 11/07/2015   BUN 15 11/07/2015   CO2 26 11/07/2015   TSH 1.82 11/07/2015    US Breast Ltd Uni Left Inc Axilla  Result Date: 11/12/2015 CLINICAL DATA:  If 40 year old patient for follow-up of probably benign cysts in both breasts. The patient is asymptomatic. EXAM: DIGITAL DIAGNOSTIC BILATERAL MAMMOGRAM WITH 3D TOMOSYNTHESIS WITH CAD ULTRASOUND BILATERAL BREAST COMPARISON:  Previous exam(s). ACR Breast Density Category b: There are scattered areas of fibroglandular density. FINDINGS: Stable circumscribed nodule in the 12 o'clock region of the right breast. No new or suspicious mass, suspicious microcalcification or distortion is identified in the right breast. In the left breast, a stable approximately 7 mm circumscribed nodule is seen in the 7 o'clock region of the left breast, with a second smaller circumscribed nodule in the same quadrant. No new or suspicious mass, distortion, or suspicious microcalcification is identified in the left breast. Mammographic images were processed with CAD. Targeted ultrasound is performed, showing a circumscribed minimally complicated cyst at 12 o'clock position 3 cm from the nipple in the right breast that measures 4 x 3 x 3 mm. There is no associated vascular flow. In the left breast at 7 o'clock position 6 cm from the nipple are 2 adjacent cysts measuring 8 x 4 x 4 mm and 7 x 3 x 5 mm respectively. No suspicious findings are seen on ultrasound of either breast. IMPRESSION: Stable appearance of benign cysts bilaterally. No evidence of malignancy in either breast. RECOMMENDATION: Screening mammogram at age 22 unless there are persistent or intervening clinical concerns. (Code:SM-B-40A) I have discussed the findings and recommendations with the patient. Results were also provided in writing at the conclusion of the visit. If applicable, a reminder letter will be sent to the patient regarding the next  appointment. BI-RADS CATEGORY  2: Benign. Electronically Signed   By: Britta Mccreedy M.D.   On: 11/12/2015 14:20   US Breast Ltd Uni Right Inc Axilla  Result Date: 11/12/2015 CLINICAL DATA:  If 40 year old patient for follow-up of probably benign cysts in both breasts. The patient is asymptomatic. EXAM: DIGITAL DIAGNOSTIC BILATERAL MAMMOGRAM WITH 3D TOMOSYNTHESIS WITH CAD ULTRASOUND BILATERAL BREAST COMPARISON:  Previous exam(s). ACR Breast Density Category b: There are scattered areas of fibroglandular density. FINDINGS: Stable circumscribed nodule in the 12 o'clock region of the right breast. No new or suspicious mass, suspicious microcalcification or distortion is identified in the right breast. In the left breast, a stable approximately 7 mm circumscribed nodule is seen in the 7 o'clock region of the left breast, with a second smaller circumscribed nodule in the same quadrant. No new or suspicious mass, distortion, or suspicious  microcalcification is identified in the left breast. Mammographic images were processed with CAD. Targeted ultrasound is performed, showing a circumscribed minimally complicated cyst at 12 o'clock position 3 cm from the nipple in the right breast that measures 4 x 3 x 3 mm. There is no associated vascular flow. In the left breast at 7 o'clock position 6 cm from the nipple are 2 adjacent cysts measuring 8 x 4 x 4 mm and 7 x 3 x 5 mm respectively. No suspicious findings are seen on ultrasound of either breast. IMPRESSION: Stable appearance of benign cysts bilaterally. No evidence of malignancy in either breast. RECOMMENDATION: Screening mammogram at age 31 unless there are persistent or intervening clinical concerns. (Code:[id number]-B-40A) I have discussed the findings and recommendations with the patient. Results were also provided in writing at the conclusion of the visit. If applicable, a reminder letter will be sent to the patient regarding the next appointment. BI-RADS CATEGORY  2: Benign.  Electronically Signed   By: Britta Mccreedy M.D.   On: 11/12/2015 14:20   Mm Diag Breast Tomo Bilateral  Result Date: 11/12/2015 CLINICAL DATA:  If 40 year old patient for follow-up of probably benign cysts in both breasts. The patient is asymptomatic. EXAM: DIGITAL DIAGNOSTIC BILATERAL MAMMOGRAM WITH 3D TOMOSYNTHESIS WITH CAD ULTRASOUND BILATERAL BREAST COMPARISON:  Previous exam(s). ACR Breast Density Category b: There are scattered areas of fibroglandular density. FINDINGS: Stable circumscribed nodule in the 12 o'clock region of the right breast. No new or suspicious mass, suspicious microcalcification or distortion is identified in the right breast. In the left breast, a stable approximately 7 mm circumscribed nodule is seen in the 7 o'clock region of the left breast, with a second smaller circumscribed nodule in the same quadrant. No new or suspicious mass, distortion, or suspicious microcalcification is identified in the left breast. Mammographic images were processed with CAD. Targeted ultrasound is performed, showing a circumscribed minimally complicated cyst at 12 o'clock position 3 cm from the nipple in the right breast that measures 4 x 3 x 3 mm. There is no associated vascular flow. In the left breast at 7 o'clock position 6 cm from the nipple are 2 adjacent cysts measuring 8 x 4 x 4 mm and 7 x 3 x 5 mm respectively. No suspicious findings are seen on ultrasound of either breast. IMPRESSION: Stable appearance of benign cysts bilaterally. No evidence of malignancy in either breast. RECOMMENDATION: Screening mammogram at age 22 unless there are persistent or intervening clinical concerns. (Code:SM-B-40A) I have discussed the findings and recommendations with the patient. Results were also provided in writing at the conclusion of the visit. If applicable, a reminder letter will be sent to the patient regarding the next appointment. BI-RADS CATEGORY  2: Benign. Electronically Signed   By: Britta Mccreedy M.D.    On: 11/12/2015 14:20       Assessment & Plan:   Problem List Items Addressed This Visit    Abnormal mammogram    Mammogram with ultrasound 11/12/15 - [facility] II.        Stress    Increased stress with her work.  Discussed with her today.  She desires no further intervention at this time.  Follow.        Thyroid fullness    Saw endocrinology.  Continue f/u with Dr Renae Fickle.  Follow tsh. Is due f/u appt now.  Last evaluated 04/2014.  Recommended f/u in 2 years.         Other Visit Diagnoses   None.  Einar Pheasant, MD

## 2016-05-10 ENCOUNTER — Encounter: Payer: Self-pay | Admitting: Internal Medicine

## 2016-05-10 DIAGNOSIS — F439 Reaction to severe stress, unspecified: Secondary | ICD-10-CM | POA: Insufficient documentation

## 2016-05-10 NOTE — Assessment & Plan Note (Signed)
Mammogram with ultrasound 11/12/15 - [facility].

## 2016-05-10 NOTE — Assessment & Plan Note (Signed)
Saw endocrinology.  Continue f/u with Dr Renae FicklePaul.  Follow tsh. Is due f/u appt now.  Last evaluated 04/2014.  Recommended f/u in 2 years.

## 2016-05-10 NOTE — Assessment & Plan Note (Signed)
Increased stress with her work.  Discussed with her today.  She desires no further intervention at this time.  Follow.

## 2016-08-15 IMAGING — MG MM DIAG BREAST TOMO BILATERAL
8 of 12 series · 8 of 28 positions shown · non-contrast
Comparison: Previous exam(s).

CLINICAL DATA: If 39-year-old patient for follow-up of probably
benign cysts in both breasts. The patient is asymptomatic.

EXAM:
DIGITAL DIAGNOSTIC BILATERAL MAMMOGRAM WITH 3D TOMOSYNTHESIS WITH
CAD
ULTRASOUND BILATERAL BREAST

[L MLO synth-2D]
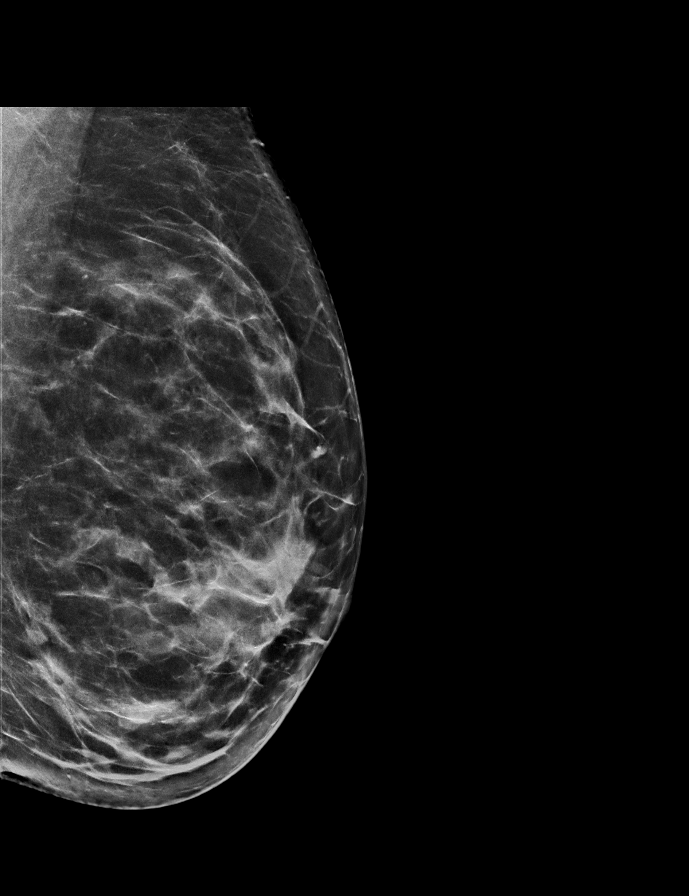

[R MLO synth-2D]
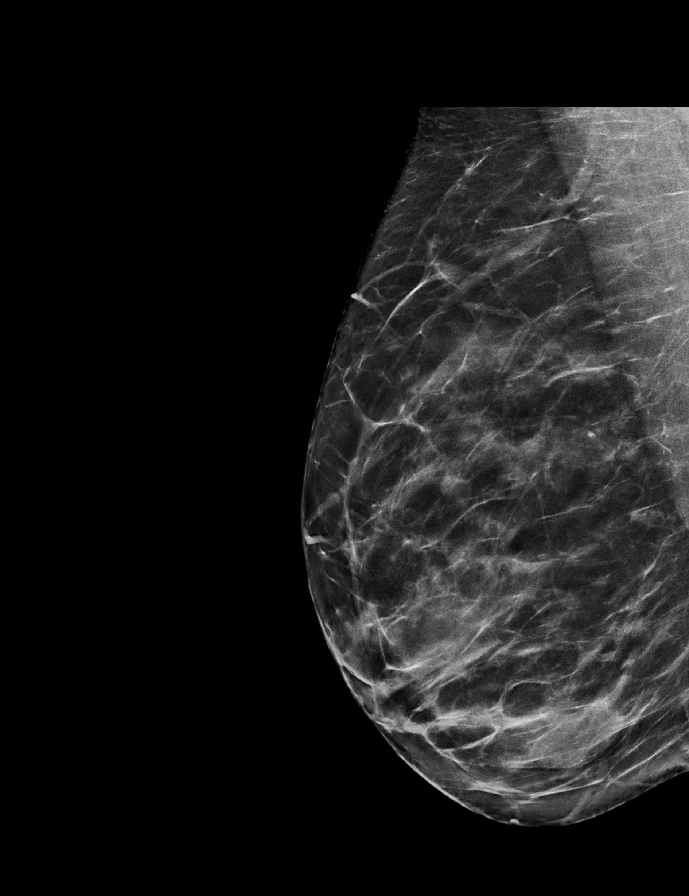

[L CC]
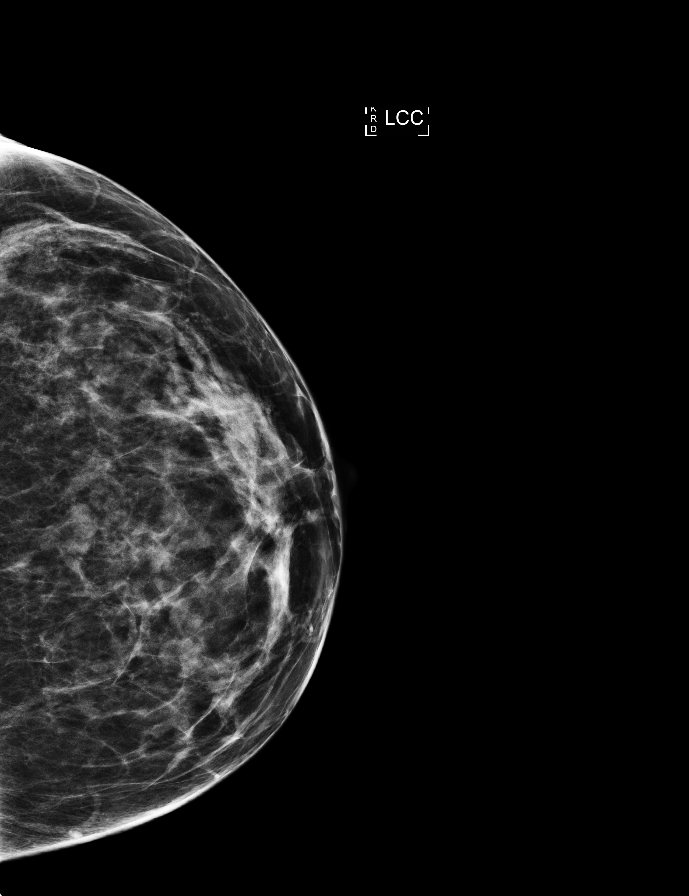

[R MLO]
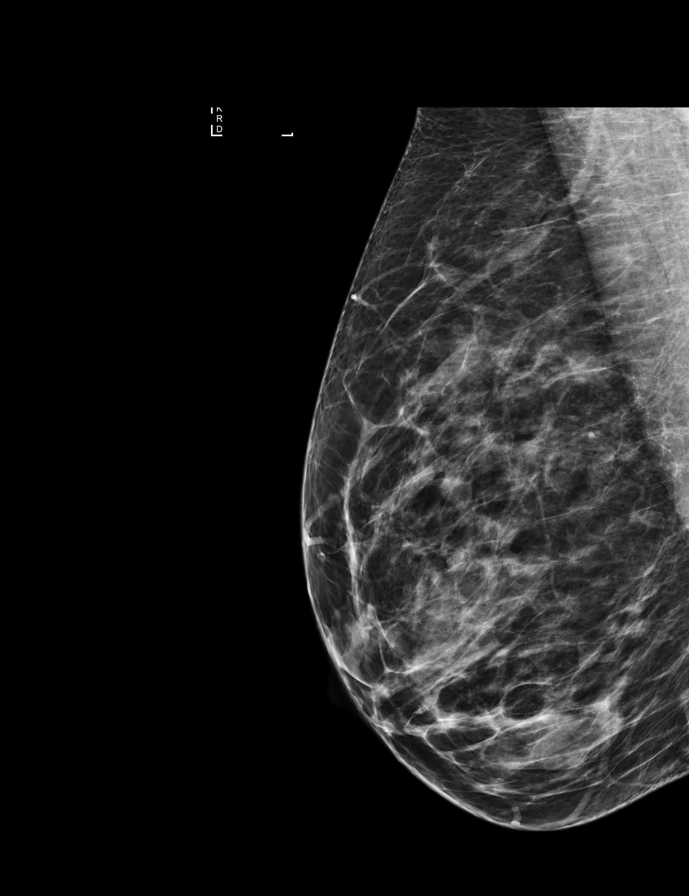

[L MLO]
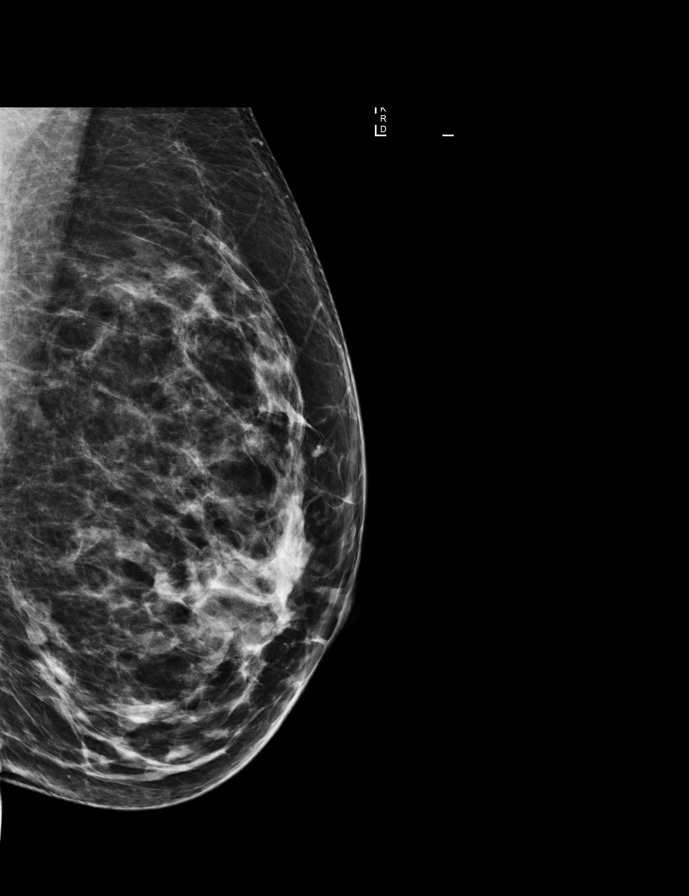

[R CC synth-2D]
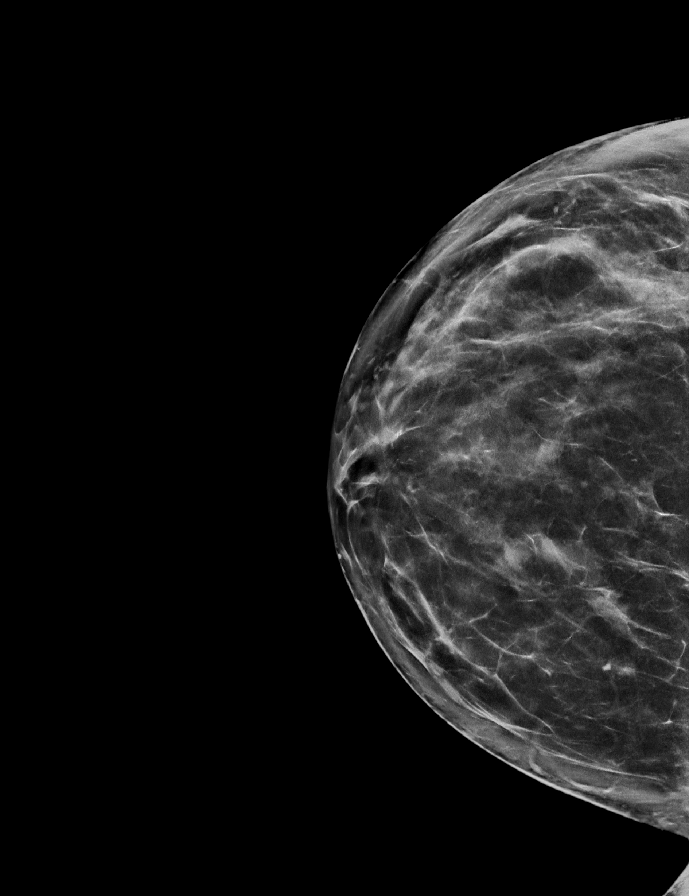

[L CC synth-2D]
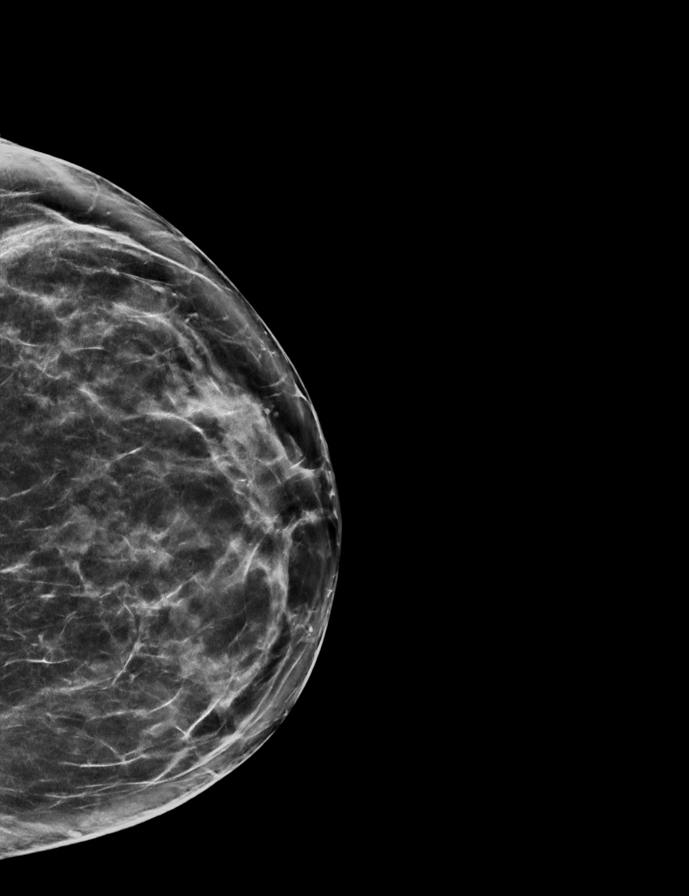

[R CC]
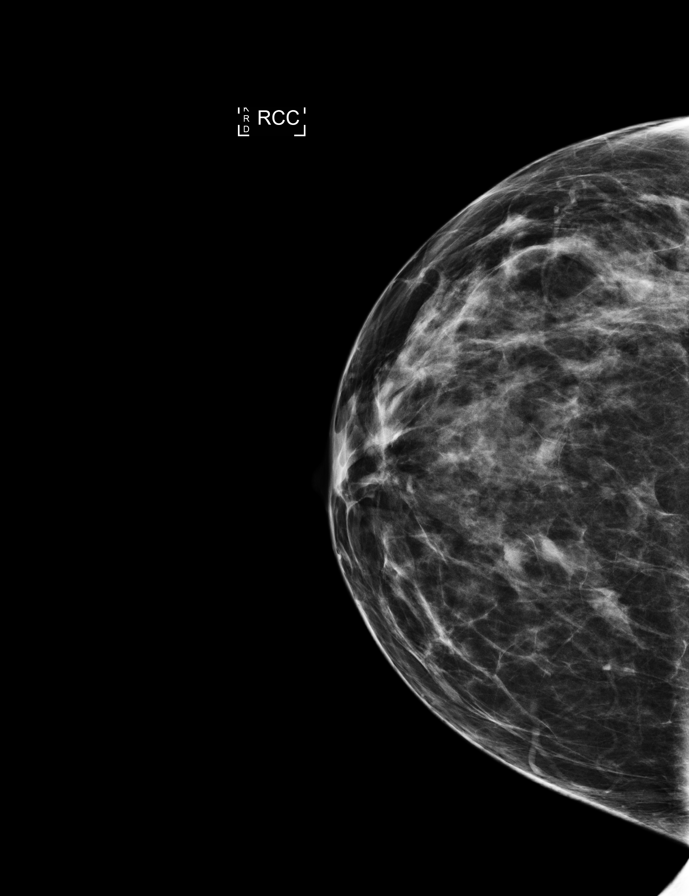

[8 of 28 positions shown; findings below may reference images not displayed]

ACR Breast Density Category b: There are scattered areas of
fibroglandular density.
FINDINGS: Stable circumscribed nodule in the 12 o'clock region of the right
breast. No new or suspicious mass, suspicious microcalcification or
distortion is identified in the right breast.

In the left breast, a stable approximately 7 mm circumscribed nodule
is seen in the 7 o'clock region of the left breast, with a second
smaller circumscribed nodule in the same quadrant. No new or
suspicious mass, distortion, or suspicious microcalcification is
identified in the left breast.

Mammographic images were processed with CAD.

Targeted ultrasound is performed, showing a circumscribed minimally
complicated cyst at 12 o'clock position 3 cm from the nipple in the
right breast that measures 4 x 3 x 3 mm. There is no associated
vascular flow.

In the left breast at 7 o'clock position 6 cm from the nipple are 2
adjacent cysts measuring 8 x 4 x 4 mm and 7 x 3 x 5 mm respectively.

No suspicious findings are seen on ultrasound of either breast.
IMPRESSION: Stable appearance of benign cysts bilaterally. No evidence of
malignancy in either breast.

RECOMMENDATION:
Screening mammogram at age 41 unless there are persistent or
intervening clinical concerns. (Code:[id number])

I have discussed the findings and recommendations with the patient.
Results were also provided in writing at the conclusion of the
visit. If applicable, a reminder letter will be sent to the patient
regarding the next appointment.

BI-RADS CATEGORY  2: Benign.

## 2016-10-19 ENCOUNTER — Other Ambulatory Visit: Payer: Self-pay | Admitting: Surgical

## 2016-10-19 MED ORDER — NORGESTIM-ETH ESTRAD TRIPHASIC 0.18/0.215/0.25 MG-35 MCG PO TABS: 1.0000 | ORAL_TABLET | Freq: Every day | ORAL | 12 refills | Status: DC

## 2016-12-28 ENCOUNTER — Encounter: Payer: Self-pay | Admitting: Internal Medicine

## 2016-12-31 ENCOUNTER — Encounter: Payer: Self-pay | Admitting: Internal Medicine

## 2016-12-31 ENCOUNTER — Ambulatory Visit (INDEPENDENT_AMBULATORY_CARE_PROVIDER_SITE_OTHER): Payer: BLUE CROSS/BLUE SHIELD | Admitting: Internal Medicine

## 2016-12-31 ENCOUNTER — Other Ambulatory Visit (HOSPITAL_COMMUNITY)
Admission: RE | Admit: 2016-12-31 | Discharge: 2016-12-31 | Disposition: A | Discharge status: Home / Self Care | Payer: BLUE CROSS/BLUE SHIELD | Source: Ambulatory Visit | Attending: Internal Medicine | Admitting: Internal Medicine

## 2016-12-31 VITALS — BP 138/76 | HR 100 | Temp 98.6°F | Resp 14 | Ht 61.81 in | Wt 141.6 lb

## 2016-12-31 DIAGNOSIS — Z01419 Encounter for gynecological examination (general) (routine) without abnormal findings: Secondary | ICD-10-CM | POA: Diagnosis not present

## 2016-12-31 DIAGNOSIS — Z1151 Encounter for screening for human papillomavirus (HPV): Secondary | ICD-10-CM | POA: Diagnosis not present

## 2016-12-31 DIAGNOSIS — F439 Reaction to severe stress, unspecified: Secondary | ICD-10-CM

## 2016-12-31 DIAGNOSIS — E0789 Other specified disorders of thyroid: Secondary | ICD-10-CM

## 2016-12-31 DIAGNOSIS — Z13220 Encounter for screening for lipoid disorders: Secondary | ICD-10-CM

## 2016-12-31 DIAGNOSIS — Z124 Encounter for screening for malignant neoplasm of cervix: Secondary | ICD-10-CM | POA: Diagnosis not present

## 2016-12-31 DIAGNOSIS — Z0000 Encounter for general adult medical examination without abnormal findings: Secondary | ICD-10-CM | POA: Diagnosis not present

## 2016-12-31 NOTE — Progress Notes (Signed)
Patient ID: Stephanie Graves, female   DOB: 04-21-1976, 41 y.o.   MRN: [id number]   Subjective:    Patient ID: Stephanie Graves, female    DOB: May 04, 1976, 41 y.o.   MRN: [id number]  HPI  Patient here for her physical exam.  She reports she is doing better.  Stress at work is better.  Hired a Radio broadcast assistant.  This has helped to relieve some of her stress.  Discussed diet and exercise.  She tries to stay active.  Breathing stable.  No acid reflux.  No abdominal pain or cramping.  Bowels stable.     Past Medical History:  Diagnosis Date  . Chicken pox as a child   Past Surgical History:  Procedure Laterality Date  . FOOT SURGERY  1995   bone removed from left foot   Family History  Problem Relation Age of Onset  . Hypertension Mother   . Hypercholesterolemia Mother   . Stroke Maternal Grandfather   . Brain cancer Paternal Grandfather   . Breast cancer Neg Hx   . Colon cancer Neg Hx    Social History   Social History  . Marital status: Single    Spouse name: N/A  . Number of children: 0  . Years of education: N/A   Social History Main Topics  . Smoking status: Never Smoker  . Smokeless tobacco: Never Used  . Alcohol use 0.0 oz/week     Comment: occasional  . Drug use: No  . Sexual activity: Not Asked   Other Topics Concern  . None   Social History Narrative  . None    Outpatient Encounter Prescriptions as of 12/31/2016  Medication Sig  . ibuprofen (ADVIL,MOTRIN) 200 MG tablet Take 200 mg by mouth every 6 (six) hours as needed.  . Norgestimate-Ethinyl Estradiol Triphasic (ORTHO TRI-CYCLEN, 28,) 0.18/0.215/0.25 MG-35 MCG tablet Take 1 tablet by mouth daily.  . [DISCONTINUED] Norgestimate-Ethinyl Estradiol Triphasic (TRI-PREVIFEM) 0.18/0.215/0.25 MG-35 MCG tablet Take 1 tablet by mouth daily.   No facility-administered encounter medications on file as of 12/31/2016.     Review of Systems  Constitutional: Negative for appetite change and unexpected weight change.  HENT:  Negative for congestion and sinus pressure.   Eyes: Negative for pain and visual disturbance.  Respiratory: Negative for cough, chest tightness and shortness of breath.   Cardiovascular: Negative for chest pain, palpitations and leg swelling.  Gastrointestinal: Negative for abdominal pain, diarrhea, nausea and vomiting.  Genitourinary: Negative for difficulty urinating and dysuria.  Musculoskeletal: Negative for back pain and joint swelling.  Skin: Negative for color change and rash.  Neurological: Negative for dizziness, light-headedness and headaches.  Hematological: Negative for adenopathy. Does not bruise/bleed easily.  Psychiatric/Behavioral: Negative for agitation and dysphoric mood.       Objective:     Blood pressure rechecked by me:  136/86-88  Physical Exam  Constitutional: She is oriented to person, place, and time. She appears well-developed and well-nourished. No distress.  HENT:  Nose: Nose normal.  Mouth/Throat: Oropharynx is clear and moist.  Eyes: Right eye exhibits no discharge. Left eye exhibits no discharge. No scleral icterus.  Neck: Neck supple. No thyromegaly present.  Cardiovascular: Normal rate and regular rhythm.   Pulmonary/Chest: Breath sounds normal. No accessory muscle usage. No tachypnea. No respiratory distress. She has no decreased breath sounds. She has no wheezes. She has no rhonchi. Right breast exhibits no inverted nipple, no mass, no nipple discharge and no tenderness (no axillary adenopathy). Left breast exhibits no inverted nipple, no  mass, no nipple discharge and no tenderness (no axilarry adenopathy).  Abdominal: Soft. Bowel sounds are normal. There is no tenderness.  Genitourinary:  Genitourinary Comments: Normal external genitalia.  Vaginal vault without lesions.  Cervix identified.  Pap smear performed.  Could not appreciate any adnexal masses or tenderness.    Musculoskeletal: She exhibits no edema or tenderness.  Lymphadenopathy:    She  has no cervical adenopathy.  Neurological: She is alert and oriented to person, place, and time.  Skin: Skin is warm. No rash noted. No erythema.  Psychiatric: She has a normal mood and affect. Her behavior is normal.    BP 138/76 (BP Location: Left Arm, Patient Position: Sitting, Cuff Size: Normal)   Pulse 100   Temp 98.6 F (37 C) (Oral)   Resp 14   Ht 5' 1.81" (1.57 m)   Wt 141 lb 9.6 oz (64.2 kg)   LMP 12/03/2016 (Approximate)   SpO2 99%   BMI 26.06 kg/m  Wt Readings from Last 3 Encounters:  12/31/16 141 lb 9.6 oz (64.2 kg)  05/05/16 134 lb 6 oz (61 kg)  10/24/15 130 lb 8 oz (59.2 kg)     Lab Results  Component Value Date   WBC 9.2 11/07/2015   HGB 13.4 11/07/2015   HCT 39.5 11/07/2015   PLT 323.0 11/07/2015   GLUCOSE 97 11/07/2015   CHOL 179 11/07/2015   TRIG 103.0 11/07/2015   HDL 61.00 11/07/2015   LDLCALC 98 11/07/2015   ALT 10 11/07/2015   AST 11 11/07/2015   NA 138 11/07/2015   K 4.0 11/07/2015   CL 105 11/07/2015   CREATININE 0.78 11/07/2015   BUN 15 11/07/2015   CO2 26 11/07/2015   TSH 1.82 11/07/2015    US Breast Ltd Uni Left Inc Axilla  Result Date: 11/12/2015 CLINICAL DATA:  If 41 year old patient for follow-up of probably benign cysts in both breasts. The patient is asymptomatic. EXAM: DIGITAL DIAGNOSTIC BILATERAL MAMMOGRAM WITH 3D TOMOSYNTHESIS WITH CAD ULTRASOUND BILATERAL BREAST COMPARISON:  Previous exam(s). ACR Breast Density Category b: There are scattered areas of fibroglandular density. FINDINGS: Stable circumscribed nodule in the 12 o'clock region of the right breast. No new or suspicious mass, suspicious microcalcification or distortion is identified in the right breast. In the left breast, a stable approximately 7 mm circumscribed nodule is seen in the 7 o'clock region of the left breast, with a second smaller circumscribed nodule in the same quadrant. No new or suspicious mass, distortion, or suspicious microcalcification is identified in the  left breast. Mammographic images were processed with CAD. Targeted ultrasound is performed, showing a circumscribed minimally complicated cyst at 12 o'clock position 3 cm from the nipple in the right breast that measures 4 x 3 x 3 mm. There is no associated vascular flow. In the left breast at 7 o'clock position 6 cm from the nipple are 2 adjacent cysts measuring 8 x 4 x 4 mm and 7 x 3 x 5 mm respectively. No suspicious findings are seen on ultrasound of either breast. IMPRESSION: Stable appearance of benign cysts bilaterally. No evidence of malignancy in either breast. RECOMMENDATION: Screening mammogram at age 46 unless there are persistent or intervening clinical concerns. (Code:SM-B-40A) I have discussed the findings and recommendations with the patient. Results were also provided in writing at the conclusion of the visit. If applicable, a reminder letter will be sent to the patient regarding the next appointment. BI-RADS CATEGORY  2: Benign. Electronically Signed   By: Britta Mccreedy  M.D.   On: 11/12/2015 14:20   [place] Breast Ltd Uni Right Inc Axilla  Result Date: 11/12/2015 CLINICAL DATA:  If 41 year old patient for follow-up of probably benign cysts in both breasts. The patient is asymptomatic. EXAM: DIGITAL DIAGNOSTIC BILATERAL MAMMOGRAM WITH 3D TOMOSYNTHESIS WITH CAD ULTRASOUND BILATERAL BREAST COMPARISON:  Previous exam(s). ACR Breast Density Category b: There are scattered areas of fibroglandular density. FINDINGS: Stable circumscribed nodule in the 12 o'clock region of the right breast. No new or suspicious mass, suspicious microcalcification or distortion is identified in the right breast. In the left breast, a stable approximately 7 mm circumscribed nodule is seen in the 7 o'clock region of the left breast, with a second smaller circumscribed nodule in the same quadrant. No new or suspicious mass, distortion, or suspicious microcalcification is identified in the left breast. Mammographic images were  processed with CAD. Targeted ultrasound is performed, showing a circumscribed minimally complicated cyst at 12 o'clock position 3 cm from the nipple in the right breast that measures 4 x 3 x 3 mm. There is no associated vascular flow. In the left breast at 7 o'clock position 6 cm from the nipple are 2 adjacent cysts measuring 8 x 4 x 4 mm and 7 x 3 x 5 mm respectively. No suspicious findings are seen on ultrasound of either breast. IMPRESSION: Stable appearance of benign cysts bilaterally. No evidence of malignancy in either breast. RECOMMENDATION: Screening mammogram at age 89 unless there are persistent or intervening clinical concerns. (Code:SM-B-40A) I have discussed the findings and recommendations with the patient. Results were also provided in writing at the conclusion of the visit. If applicable, a reminder letter will be sent to the patient regarding the next appointment. BI-RADS CATEGORY  2: Benign. Electronically Signed   By: Britta MccreedySusan  Turner M.D.   On: 11/12/2015 14:20   Mm Diag Breast Tomo Bilateral  Result Date: 11/12/2015 CLINICAL DATA:  If 41 year old patient for follow-up of probably benign cysts in both breasts. The patient is asymptomatic. EXAM: DIGITAL DIAGNOSTIC BILATERAL MAMMOGRAM WITH 3D TOMOSYNTHESIS WITH CAD ULTRASOUND BILATERAL BREAST COMPARISON:  Previous exam(s). ACR Breast Density Category b: There are scattered areas of fibroglandular density. FINDINGS: Stable circumscribed nodule in the 12 o'clock region of the right breast. No new or suspicious mass, suspicious microcalcification or distortion is identified in the right breast. In the left breast, a stable approximately 7 mm circumscribed nodule is seen in the 7 o'clock region of the left breast, with a second smaller circumscribed nodule in the same quadrant. No new or suspicious mass, distortion, or suspicious microcalcification is identified in the left breast. Mammographic images were processed with CAD. Targeted ultrasound is  performed, showing a circumscribed minimally complicated cyst at 12 o'clock position 3 cm from the nipple in the right breast that measures 4 x 3 x 3 mm. There is no associated vascular flow. In the left breast at 7 o'clock position 6 cm from the nipple are 2 adjacent cysts measuring 8 x 4 x 4 mm and 7 x 3 x 5 mm respectively. No suspicious findings are seen on ultrasound of either breast. IMPRESSION: Stable appearance of benign cysts bilaterally. No evidence of malignancy in either breast. RECOMMENDATION: Screening mammogram at age 41 unless there are persistent or intervening clinical concerns. (Code:SM-B-40A) I have discussed the findings and recommendations with the patient. Results were also provided in writing at the conclusion of the visit. If applicable, a reminder letter will be sent to the patient regarding the next appointment. BI-RADS  CATEGORY  2: Benign. Electronically Signed   By: Britta Mccreedy M.D.   On: 11/12/2015 14:20       Assessment & Plan:   Problem List Items Addressed This Visit    Health care maintenance    Physical today 12/31/16.  PAP 12/31/16.  Mammogram 11/12/15 as outlined.  Recommended routine screening at 41.        Stress    Stress is better.  Discussed with her today.  Doing well.  Follow.        Thyroid fullness    Saw endocrinology.  Continue f/u with Dr Renae Fickle.  Follow tsh.  Last evaluated 04/2014.  Recommended 2 year f/u.  Overdue.  Will need to schedule.  Discussed with her today regarding f/u.        Relevant Orders   Comprehensive metabolic panel   CBC with Differential/Platelet   TSH    Other Visit Diagnoses    Routine general medical examination at a health care facility    -  Primary   Screening for cervical cancer       Relevant Orders   Cytology - PAP   Screening cholesterol level       Relevant Orders   Lipid panel       Dale , MD

## 2016-12-31 NOTE — Progress Notes (Signed)
Pre-visit discussion using our clinic review tool. No additional management support is needed unless otherwise documented below in the visit note.  

## 2017-01-03 ENCOUNTER — Encounter: Payer: Self-pay | Admitting: Internal Medicine

## 2017-01-03 NOTE — Assessment & Plan Note (Signed)
Physical today 12/31/16.  PAP 12/31/16.  Mammogram 11/12/15 as outlined.  Recommended routine screening at 41.

## 2017-01-03 NOTE — Assessment & Plan Note (Signed)
Stress is better.  Discussed with her today.  Doing well.  Follow.

## 2017-01-03 NOTE — Assessment & Plan Note (Signed)
Saw endocrinology.  Continue f/u with Dr Renae Fickle.  Follow tsh.  Last evaluated 04/2014.  Recommended 2 year f/u.  Overdue.  Will need to schedule.  Discussed with her today regarding f/u.

## 2017-01-06 ENCOUNTER — Encounter: Payer: Self-pay | Admitting: Internal Medicine

## 2017-01-06 LAB — CYTOLOGY - PAP
DIAGNOSIS: NEGATIVE
HPV: NOT DETECTED

## 2017-01-14 ENCOUNTER — Encounter: Payer: Self-pay | Admitting: Internal Medicine

## 2017-01-15 ENCOUNTER — Other Ambulatory Visit: Payer: Self-pay

## 2017-01-15 NOTE — Telephone Encounter (Signed)
Fax received for refill on Norgestimate-Ethinyl Estradiol Triphasic   She had pap on 01-06-17 Last script was on 10-24-15 with 12 rf

## 2017-01-16 MED ORDER — NORGESTIM-ETH ESTRAD TRIPHASIC 0.18/0.215/0.25 MG-35 MCG PO TABS: 1.0000 | ORAL_TABLET | Freq: Every day | ORAL | 12 refills | Status: DC

## 2017-01-16 NOTE — Telephone Encounter (Signed)
rx ok'd for one year on ocp's.

## 2017-02-18 ENCOUNTER — Ambulatory Visit (INDEPENDENT_AMBULATORY_CARE_PROVIDER_SITE_OTHER): Payer: BLUE CROSS/BLUE SHIELD | Admitting: Internal Medicine

## 2017-02-18 ENCOUNTER — Encounter: Payer: Self-pay | Admitting: Internal Medicine

## 2017-02-18 DIAGNOSIS — E0789 Other specified disorders of thyroid: Secondary | ICD-10-CM

## 2017-02-18 DIAGNOSIS — F439 Reaction to severe stress, unspecified: Secondary | ICD-10-CM | POA: Diagnosis not present

## 2017-02-18 DIAGNOSIS — R928 Other abnormal and inconclusive findings on diagnostic imaging of breast: Secondary | ICD-10-CM

## 2017-02-18 NOTE — Progress Notes (Signed)
Pre-visit discussion using our clinic review tool. No additional management support is needed unless otherwise documented below in the visit note.  

## 2017-02-18 NOTE — Progress Notes (Signed)
Patient ID: Stephanie GillisJessica Anastas, female   DOB: June 01, 1976, 41 y.o.   MRN: [id number]   Subjective:    Patient ID: Stephanie GillisJessica Hulick, female    DOB: June 01, 1976, 41 y.o.   MRN: [id number]  HPI  Patient here for a scheduled follow up.  She reports she is doing well.  Stress is better.  Work is better.  Has lost some weight.  Adjusted her diet.  Discussed diet and exercise.  No chest pain.  No sob.  No acid reflux.  No abdominal pain.  Bowels moving.  Overall she feels she is doing well.     Past Medical History:  Diagnosis Date  . Chicken pox as a child   Past Surgical History:  Procedure Laterality Date  . FOOT SURGERY  1995   bone removed from left foot   Family History  Problem Relation Age of Onset  . Hypertension Mother   . Hypercholesterolemia Mother   . Stroke Maternal Grandfather   . Brain cancer Paternal Grandfather   . Breast cancer Neg Hx   . Colon cancer Neg Hx    Social History   Social History  . Marital status: Single    Spouse name: N/A  . Number of children: 0  . Years of education: N/A   Social History Main Topics  . Smoking status: Never Smoker  . Smokeless tobacco: Never Used  . Alcohol use 0.0 oz/week     Comment: occasional  . Drug use: No  . Sexual activity: Not Asked   Other Topics Concern  . None   Social History Narrative  . None    Outpatient Encounter Prescriptions as of 02/18/2017  Medication Sig  . ibuprofen (ADVIL,MOTRIN) 200 MG tablet Take 200 mg by mouth every 6 (six) hours as needed.  . Norgestimate-Ethinyl Estradiol Triphasic (ORTHO TRI-CYCLEN, 28,) 0.18/0.215/0.25 MG-35 MCG tablet Take 1 tablet by mouth daily.   No facility-administered encounter medications on file as of 02/18/2017.     Review of Systems  Constitutional: Negative for appetite change and unexpected weight change.  HENT: Negative for congestion and sinus pressure.   Respiratory: Negative for cough, chest tightness and shortness of breath.   Cardiovascular: Negative for  chest pain and palpitations.  Gastrointestinal: Negative for abdominal pain, diarrhea, nausea and vomiting.  Musculoskeletal: Negative for joint swelling and myalgias.  Skin: Negative for color change and rash.  Neurological: Negative for dizziness and headaches.  Psychiatric/Behavioral: Negative for agitation and dysphoric mood.       Objective:    Physical Exam  Constitutional: She appears well-developed and well-nourished. No distress.  HENT:  Nose: Nose normal.  Mouth/Throat: Oropharynx is clear and moist.  Neck: Neck supple. No thyromegaly present.  Cardiovascular: Normal rate and regular rhythm.   Pulmonary/Chest: Breath sounds normal. No respiratory distress. She has no wheezes.  Abdominal: Soft. Bowel sounds are normal. There is no tenderness.  Musculoskeletal: She exhibits no edema or tenderness.  Lymphadenopathy:    She has no cervical adenopathy.  Skin: No rash noted. No erythema.  Psychiatric: She has a normal mood and affect. Her behavior is normal.    BP 130/74 (BP Location: Left Arm, Patient Position: Sitting, Cuff Size: Normal)   Pulse 71   Temp 98.6 F (37 C) (Oral)   Resp 12   Ht 5\' 2"  (1.575 m)   Wt 137 lb 3.2 oz (62.2 kg)   LMP 02/02/2017   SpO2 98%   BMI 25.09 kg/m  Wt Readings from Last 3 Encounters:  02/18/17 137 lb 3.2 oz (62.2 kg)  12/31/16 141 lb 9.6 oz (64.2 kg)  05/05/16 134 lb 6 oz (61 kg)     Lab Results  Component Value Date   WBC 9.2 11/07/2015   HGB 13.4 11/07/2015   HCT 39.5 11/07/2015   PLT 323.0 11/07/2015   GLUCOSE 97 11/07/2015   CHOL 179 11/07/2015   TRIG 103.0 11/07/2015   HDL 61.00 11/07/2015   LDLCALC 98 11/07/2015   ALT 10 11/07/2015   AST 11 11/07/2015   NA 138 11/07/2015   K 4.0 11/07/2015   CL 105 11/07/2015   CREATININE 0.78 11/07/2015   BUN 15 11/07/2015   CO2 26 11/07/2015   TSH 1.82 11/07/2015       Assessment & Plan:   Problem List Items Addressed This Visit    Abnormal mammogram    Had  mammogram 11/2015.  Discussed with her today.  Wants to hold on mammogram at this time.  Will notify me when agreeable.        Stress    Stress is better.  Doing well.  Follow.        Thyroid fullness    Saw Dr Renae Fickle.  Last evaluated 04/2014.  Recommended 2 year f/u.  Overdue.  Schedule f/u with endocrinology.        Relevant Orders   Ambulatory referral to Endocrinology       Dale Gaston, MD

## 2017-03-01 ENCOUNTER — Encounter: Payer: Self-pay | Admitting: Internal Medicine

## 2017-03-01 NOTE — Assessment & Plan Note (Signed)
Had mammogram 11/2015.  Discussed with her today.  Wants to hold on mammogram at this time.  Will notify me when agreeable.

## 2017-03-01 NOTE — Assessment & Plan Note (Signed)
Stress is better.  Doing well.  Follow.   

## 2017-03-01 NOTE — Assessment & Plan Note (Signed)
Saw Dr Renae FicklePaul.  Last evaluated 04/2014.  Recommended 2 year f/u.  Overdue.  Schedule f/u with endocrinology.

## 2017-04-06 DIAGNOSIS — E041 Nontoxic single thyroid nodule: Secondary | ICD-10-CM | POA: Diagnosis not present

## 2017-04-12 DIAGNOSIS — E041 Nontoxic single thyroid nodule: Secondary | ICD-10-CM | POA: Diagnosis not present

## 2017-08-30 ENCOUNTER — Encounter: Payer: Self-pay | Admitting: Internal Medicine

## 2017-08-30 ENCOUNTER — Ambulatory Visit: Payer: BLUE CROSS/BLUE SHIELD | Admitting: Internal Medicine

## 2017-08-30 VITALS — BP 122/78 | HR 68 | Temp 98.2°F | Ht 62.0 in | Wt 145.0 lb

## 2017-08-30 DIAGNOSIS — E0789 Other specified disorders of thyroid: Secondary | ICD-10-CM | POA: Diagnosis not present

## 2017-08-30 DIAGNOSIS — Z23 Encounter for immunization: Secondary | ICD-10-CM | POA: Diagnosis not present

## 2017-08-30 DIAGNOSIS — Z789 Other specified health status: Secondary | ICD-10-CM

## 2017-08-30 DIAGNOSIS — Z1239 Encounter for other screening for malignant neoplasm of breast: Secondary | ICD-10-CM

## 2017-08-30 DIAGNOSIS — Z1231 Encounter for screening mammogram for malignant neoplasm of breast: Secondary | ICD-10-CM | POA: Diagnosis not present

## 2017-08-30 DIAGNOSIS — F439 Reaction to severe stress, unspecified: Secondary | ICD-10-CM | POA: Diagnosis not present

## 2017-08-30 DIAGNOSIS — Z309 Encounter for contraceptive management, unspecified: Secondary | ICD-10-CM

## 2017-08-30 NOTE — Progress Notes (Signed)
Patient ID: Stephanie Graves, female   DOB: 07/15/1976, 41 y.o.   MRN: [id number]   Subjective:    Patient ID: Stephanie GillisJessica Graves, female    DOB: 07/15/1976, 41 y.o.   MRN: [id number]  HPI  Patient here for a scheduled follow up.  She reports she is doing well.  Feels good.  No chest pain.  No sob.  Discussed diet and exercise.  She is not exercising regularly, but plans to start.  No acid reflux reported.  No abdominal pain.  Bowels moving.  Taking ocp's. Periods regular.     Past Medical History:  Diagnosis Date  . Chicken pox as a child   Past Surgical History:  Procedure Laterality Date  . FOOT SURGERY  1995   bone removed from left foot   Family History  Problem Relation Age of Onset  . Hypertension Mother   . Hypercholesterolemia Mother   . Stroke Maternal Grandfather   . Brain cancer Paternal Grandfather   . Breast cancer Neg Hx   . Colon cancer Neg Hx    Social History   Socioeconomic History  . Marital status: Single    Spouse name: None  . Number of children: 0  . Years of education: None  . Highest education level: None  Social Needs  . Financial resource strain: None  . Food insecurity - worry: None  . Food insecurity - inability: None  . Transportation needs - medical: None  . Transportation needs - non-medical: None  Occupational History  . None  Tobacco Use  . Smoking status: Never Smoker  . Smokeless tobacco: Never Used  Substance and Sexual Activity  . Alcohol use: Yes    Alcohol/week: 0.0 oz    Comment: occasional  . Drug use: No  . Sexual activity: None  Other Topics Concern  . None  Social History Narrative  . None    Outpatient Encounter Medications as of 08/30/2017  Medication Sig  . ibuprofen (ADVIL,MOTRIN) 200 MG tablet Take 200 mg by mouth every 6 (six) hours as needed.  . Norgestimate-Ethinyl Estradiol Triphasic (ORTHO TRI-CYCLEN, 28,) 0.18/0.215/0.25 MG-35 MCG tablet Take 1 tablet by mouth daily.   No facility-administered encounter  medications on file as of 08/30/2017.     Review of Systems  Constitutional: Negative for appetite change and unexpected weight change.  HENT: Negative for congestion and sinus pressure.   Respiratory: Negative for cough, chest tightness and shortness of breath.   Cardiovascular: Negative for chest pain, palpitations and leg swelling.  Gastrointestinal: Negative for abdominal pain, diarrhea, nausea and vomiting.  Genitourinary: Negative for difficulty urinating and dysuria.  Musculoskeletal: Negative for back pain and joint swelling.  Skin: Negative for color change and rash.  Neurological: Negative for dizziness, light-headedness and headaches.  Psychiatric/Behavioral: Negative for agitation and dysphoric mood.       Objective:    Physical Exam  Constitutional: She appears well-developed and well-nourished. No distress.  HENT:  Nose: Nose normal.  Mouth/Throat: Oropharynx is clear and moist.  Neck: Neck supple. No thyromegaly present.  Cardiovascular: Normal rate and regular rhythm.  Pulmonary/Chest: Breath sounds normal. No respiratory distress. She has no wheezes.  Abdominal: Soft. Bowel sounds are normal. There is no tenderness.  Musculoskeletal: She exhibits no edema or tenderness.  Lymphadenopathy:    She has no cervical adenopathy.  Skin: No rash noted. No erythema.  Psychiatric: She has a normal mood and affect. Her behavior is normal.    BP 122/78 (BP Location: Left Arm, Patient  Position: Sitting, Cuff Size: Normal)   Pulse 68   Temp 98.2 F (36.8 C) (Oral)   Ht 5\' 2"  (1.575 m)   Wt 145 lb (65.8 kg)   LMP  (LMP Unknown)   SpO2 97%   BMI 26.52 kg/m  Wt Readings from Last 3 Encounters:  08/30/17 145 lb (65.8 kg)  02/18/17 137 lb 3.2 oz (62.2 kg)  12/31/16 141 lb 9.6 oz (64.2 kg)     Lab Results  Component Value Date   WBC 9.2 11/07/2015   HGB 13.4 11/07/2015   HCT 39.5 11/07/2015   PLT 323.0 11/07/2015   GLUCOSE 97 11/07/2015   CHOL 179 11/07/2015    TRIG 103.0 11/07/2015   HDL 61.00 11/07/2015   LDLCALC 98 11/07/2015   ALT 10 11/07/2015   AST 11 11/07/2015   NA 138 11/07/2015   K 4.0 11/07/2015   CL 105 11/07/2015   CREATININE 0.78 11/07/2015   BUN 15 11/07/2015   CO2 26 11/07/2015   TSH 1.82 11/07/2015       Assessment & Plan:   Problem List Items Addressed This Visit    Stress    Better.  Follow.        Thyroid fullness    Saw Dr Renae FicklePaul.  Appears to be due f/u.  Was scheduled to see Dr Tedd SiasSolum last visit.        Uses birth control    On ocp's.  Blood pressure on my check initially higher than on initial check in office.  Recheck wnl.  Follow pressures.         Other Visit Diagnoses    Breast cancer screening    -  Primary   is 41.  schedule mammogram.    Relevant Orders   MM Digital Screening   Need for influenza vaccination       Relevant Orders   Flu Vaccine QUAD 6+ mos PF IM (Fluarix Quad PF) (Completed)       Dale DurhamSCOTT, Erlean Mealor, MD

## 2017-09-02 ENCOUNTER — Encounter: Payer: Self-pay | Admitting: Internal Medicine

## 2017-09-02 DIAGNOSIS — Z789 Other specified health status: Secondary | ICD-10-CM | POA: Insufficient documentation

## 2017-09-02 NOTE — Assessment & Plan Note (Signed)
Better.  Follow.  

## 2017-09-02 NOTE — Assessment & Plan Note (Signed)
Saw Dr Renae FicklePaul.  Appears to be due f/u.  Was scheduled to see Dr Tedd SiasSolum last visit.

## 2017-09-02 NOTE — Assessment & Plan Note (Signed)
On ocp's.  Blood pressure on my check initially higher than on initial check in office.  Recheck wnl.  Follow pressures.

## 2017-09-13 ENCOUNTER — Encounter: Payer: Self-pay | Admitting: Internal Medicine

## 2017-09-14 ENCOUNTER — Other Ambulatory Visit: Payer: Self-pay

## 2017-09-17 ENCOUNTER — Ambulatory Visit
Admission: RE | Admit: 2017-09-17 | Discharge: 2017-09-17 | Disposition: A | Discharge status: Home / Self Care | Payer: BLUE CROSS/BLUE SHIELD | Source: Ambulatory Visit | Attending: Internal Medicine | Admitting: Internal Medicine

## 2017-09-17 DIAGNOSIS — Z1231 Encounter for screening mammogram for malignant neoplasm of breast: Secondary | ICD-10-CM | POA: Diagnosis not present

## 2017-09-17 DIAGNOSIS — Z1239 Encounter for other screening for malignant neoplasm of breast: Secondary | ICD-10-CM

## 2018-01-03 ENCOUNTER — Ambulatory Visit (INDEPENDENT_AMBULATORY_CARE_PROVIDER_SITE_OTHER): Payer: BLUE CROSS/BLUE SHIELD | Admitting: Internal Medicine

## 2018-01-03 VITALS — BP 138/90 | HR 100 | Temp 98.3°F | Resp 18 | Wt 144.6 lb

## 2018-01-03 DIAGNOSIS — Z789 Other specified health status: Secondary | ICD-10-CM

## 2018-01-03 DIAGNOSIS — R030 Elevated blood-pressure reading, without diagnosis of hypertension: Secondary | ICD-10-CM

## 2018-01-03 DIAGNOSIS — E0789 Other specified disorders of thyroid: Secondary | ICD-10-CM

## 2018-01-03 DIAGNOSIS — Z0000 Encounter for general adult medical examination without abnormal findings: Secondary | ICD-10-CM

## 2018-01-03 DIAGNOSIS — F439 Reaction to severe stress, unspecified: Secondary | ICD-10-CM | POA: Diagnosis not present

## 2018-01-03 NOTE — Progress Notes (Signed)
Patient ID: Stephanie GillisJessica Graves, female   DOB: 06/07/1976, 42 y.o.   MRN: [id number]   Subjective:    Patient ID: Stephanie GillisJessica Graves, female    DOB: 06/07/1976, 42 y.o.   MRN: [id number]  HPI  Patient here for her physical exam.  She reports she is doing well.  Working.  Handling stress.  Does not feel needs anything more at this time.  Trying to stay active.  No chest pain.  No sob.  No acid reflux.  No abdominal pain.  Bowels moving.  No urine change.  Taking ocp's.  Discussed with her today.  Not sexually active.  Blood pressure is borderline.  Discussed risk given age 42, elevated blood pressure and use of ocp's.     Past Medical History:  Diagnosis Date  . Chicken pox as a child   Past Surgical History:  Procedure Laterality Date  . FOOT SURGERY  1995   bone removed from left foot   Family History  Problem Relation Age of Onset  . Hypertension Mother   . Hypercholesterolemia Mother   . Stroke Maternal Grandfather   . Brain cancer Paternal Grandfather   . Breast cancer Neg Hx   . Colon cancer Neg Hx    Social History   Socioeconomic History  . Marital status: Single    Spouse name: Not on file  . Number of children: 0  . Years of education: Not on file  . Highest education level: Not on file  Occupational History  . Not on file  Social Needs  . Financial resource strain: Not on file  . Food insecurity:    Worry: Not on file    Inability: Not on file  . Transportation needs:    Medical: Not on file    Non-medical: Not on file  Tobacco Use  . Smoking status: Never Smoker  . Smokeless tobacco: Never Used  Substance and Sexual Activity  . Alcohol use: Yes    Alcohol/week: 0.0 oz    Comment: occasional  . Drug use: No  . Sexual activity: Not on file  Lifestyle  . Physical activity:    Days per week: Not on file    Minutes per session: Not on file  . Stress: Not on file  Relationships  . Social connections:    Talks on phone: Not on file    Gets together: Not on file      Attends religious service: Not on file    Active member of club or organization: Not on file    Attends meetings of clubs or organizations: Not on file    Relationship status: Not on file  Other Topics Concern  . Not on file  Social History Narrative  . Not on file    Outpatient Encounter Medications as of 01/03/2018  Medication Sig  . ibuprofen (ADVIL,MOTRIN) 200 MG tablet Take 200 mg by mouth every 6 (six) hours as needed.  . Norgestimate-Ethinyl Estradiol Triphasic (ORTHO TRI-CYCLEN, 28,) 0.18/0.215/0.25 MG-35 MCG tablet Take 1 tablet by mouth daily.   No facility-administered encounter medications on file as of 01/03/2018.     Review of Systems  Constitutional: Negative for appetite change and unexpected weight change.  HENT: Negative for congestion and sinus pressure.   Eyes: Negative for pain and visual disturbance.  Respiratory: Negative for cough, chest tightness and shortness of breath.   Cardiovascular: Negative for chest pain, palpitations and leg swelling.  Gastrointestinal: Negative for abdominal pain, diarrhea, nausea and vomiting.  Genitourinary: Negative for  difficulty urinating and dysuria.  Musculoskeletal: Negative for joint swelling and myalgias.  Skin: Negative for color change and rash.  Neurological: Negative for dizziness, light-headedness and headaches.  Hematological: Negative for adenopathy. Does not bruise/bleed easily.  Psychiatric/Behavioral: Negative for agitation and dysphoric mood.       Objective:     Blood pressure rechecked by me:  136/86  Physical Exam  Constitutional: She is oriented to person, place, and time. She appears well-developed and well-nourished. No distress.  HENT:  Nose: Nose normal.  Mouth/Throat: Oropharynx is clear and moist.  Eyes: Right eye exhibits no discharge. Left eye exhibits no discharge. No scleral icterus.  Neck: Neck supple. No thyromegaly present.  Cardiovascular: Normal rate and regular rhythm.   Pulmonary/Chest: Breath sounds normal. No accessory muscle usage. No tachypnea. No respiratory distress. She has no decreased breath sounds. She has no wheezes. She has no rhonchi. Right breast exhibits no inverted nipple, no mass, no nipple discharge and no tenderness (no axillary adenopathy). Left breast exhibits no inverted nipple, no mass, no nipple discharge and no tenderness (no axilarry adenopathy).  Abdominal: Soft. Bowel sounds are normal. There is no tenderness.  Musculoskeletal: She exhibits no edema or tenderness.  Lymphadenopathy:    She has no cervical adenopathy.  Neurological: She is alert and oriented to person, place, and time.  Skin: Skin is warm. No rash noted. No erythema.  Psychiatric: She has a normal mood and affect. Her behavior is normal.    BP 138/90 (BP Location: Left Arm, Patient Position: Sitting, Cuff Size: Normal)   Pulse 100   Temp 98.3 F (36.8 C) (Oral)   Resp 18   Wt 144 lb 9.6 oz (65.6 kg)   SpO2 96%   BMI 26.45 kg/m  Wt Readings from Last 3 Encounters:  01/03/18 144 lb 9.6 oz (65.6 kg)  08/30/17 145 lb (65.8 kg)  02/18/17 137 lb 3.2 oz (62.2 kg)     Lab Results  Component Value Date   WBC 9.2 11/07/2015   HGB 13.4 11/07/2015   HCT 39.5 11/07/2015   PLT 323.0 11/07/2015   GLUCOSE 97 11/07/2015   CHOL 179 11/07/2015   TRIG 103.0 11/07/2015   HDL 61.00 11/07/2015   LDLCALC 98 11/07/2015   ALT 10 11/07/2015   AST 11 11/07/2015   NA 138 11/07/2015   K 4.0 11/07/2015   CL 105 11/07/2015   CREATININE 0.78 11/07/2015   BUN 15 11/07/2015   CO2 26 11/07/2015   TSH 1.82 11/07/2015    Mm Digital Screening  Result Date: 09/20/2017 CLINICAL DATA:  Screening. EXAM: DIGITAL SCREENING BILATERAL MAMMOGRAM WITH CAD COMPARISON:  Previous exam(s). ACR Breast Density Category c: The breast tissue is heterogeneously dense, which may obscure small masses. FINDINGS: There are no findings suspicious for malignancy. Images were processed with CAD.  IMPRESSION: No mammographic evidence of malignancy. A result letter of this screening mammogram will be mailed directly to the patient. RECOMMENDATION: Screening mammogram in one year. (Code:SM-B-01Y) BI-RADS CATEGORY  1: Negative. Electronically Signed   By: Ted Mcalpine M.D.   On: 09/20/2017 17:25       Assessment & Plan:   Problem List Items Addressed This Visit    Elevated blood pressure reading    Blood pressure as outlined.  Refer to gyn to discuss birth control options.  Follow pressures.  Hold on medication.        Health care maintenance    Physical today 01/04/18.  PAP 12/31/16 - negative with  negative HPV.  Mammogram 09/20/17 - Birads I.        Stress    Handling stress.  Overall doing well.  Follow.       Thyroid fullness    Evaluated by Dr Tedd Sias 04/2017.  Had f/u ultrasound.  Radiology recommended continued surveillance.  Continue f/u with endocrinology.        Uses birth control    On ocp's.  Blood pressure initially elevated.  Recheck borderline.  Discussed my concern regarding age 79, elevated blood pressure and ocp's.  Refer to gyn to discuss birth control options.  No currently sexually active.            Dale [place], MD

## 2018-01-10 ENCOUNTER — Encounter: Payer: Self-pay | Admitting: Internal Medicine

## 2018-01-10 DIAGNOSIS — R030 Elevated blood-pressure reading, without diagnosis of hypertension: Secondary | ICD-10-CM | POA: Insufficient documentation

## 2018-01-10 NOTE — Assessment & Plan Note (Addendum)
Evaluated by Dr Tedd SiasSolum 04/2017.  Had f/u ultrasound.  Radiology recommended continued surveillance.  Continue f/u with endocrinology.

## 2018-01-10 NOTE — Assessment & Plan Note (Signed)
Blood pressure as outlined.  Refer to gyn to discuss birth control options.  Follow pressures.  Hold on medication.

## 2018-01-10 NOTE — Assessment & Plan Note (Signed)
On ocp's.  Blood pressure initially elevated.  Recheck borderline.  Discussed my concern regarding age 42, elevated blood pressure and ocp's.  Refer to gyn to discuss birth control options.  No currently sexually active.

## 2018-01-10 NOTE — Assessment & Plan Note (Signed)
Physical today 01/04/18.  PAP 12/31/16 - negative with negative HPV.  Mammogram 09/20/17 - Birads I.

## 2018-01-10 NOTE — Assessment & Plan Note (Signed)
Handling stress.  Overall doing well.  Follow.   

## 2018-03-10 ENCOUNTER — Encounter: Payer: Self-pay | Admitting: Internal Medicine

## 2018-03-10 ENCOUNTER — Ambulatory Visit: Payer: BLUE CROSS/BLUE SHIELD | Admitting: Internal Medicine

## 2018-03-10 DIAGNOSIS — R030 Elevated blood-pressure reading, without diagnosis of hypertension: Secondary | ICD-10-CM | POA: Diagnosis not present

## 2018-03-10 DIAGNOSIS — E0789 Other specified disorders of thyroid: Secondary | ICD-10-CM | POA: Diagnosis not present

## 2018-03-10 DIAGNOSIS — F439 Reaction to severe stress, unspecified: Secondary | ICD-10-CM | POA: Diagnosis not present

## 2018-03-10 NOTE — Progress Notes (Signed)
Patient ID: Stephanie Graves, female   DOB: [birth year], 42 y.o.   MRN: [id number]   Subjective:    Patient ID: Stephanie Graves, female    DOB: 07/07/1976, 42 y.o.   MRN: [id number]  HPI  Patient here for a scheduled follow up.  She reports she is doing relatively well.  Handling stress at work.  Does not feel needs any further intervention.  No chest pain.  No sob.  No acid reflux.  No abdominal pain.  Bowels moving.  No urine change.  Has not checked her pressure.  Not sexually active.  Ok to stop ocp's.     Past Medical History:  Diagnosis Date  . Chicken pox as a child   Past Surgical History:  Procedure Laterality Date  . FOOT SURGERY  1995   bone removed from left foot   Family History  Problem Relation Age of Onset  . Hypertension Mother   . Hypercholesterolemia Mother   . Stroke Maternal Grandfather   . Brain cancer Paternal Grandfather   . Breast cancer Neg Hx   . Colon cancer Neg Hx    Social History   Socioeconomic History  . Marital status: Single    Spouse name: Not on file  . Number of children: 0  . Years of education: Not on file  . Highest education level: Not on file  Occupational History  . Not on file  Social Needs  . Financial resource strain: Not on file  . Food insecurity:    Worry: Not on file    Inability: Not on file  . Transportation needs:    Medical: Not on file    Non-medical: Not on file  Tobacco Use  . Smoking status: Never Smoker  . Smokeless tobacco: Never Used  Substance and Sexual Activity  . Alcohol use: Yes    Alcohol/week: 0.0 oz    Comment: occasional  . Drug use: No  . Sexual activity: Not on file  Lifestyle  . Physical activity:    Days per week: Not on file    Minutes per session: Not on file  . Stress: Not on file  Relationships  . Social connections:    Talks on phone: Not on file    Gets together: Not on file    Attends religious service: Not on file    Active member of club or organization: Not on file    Attends  meetings of clubs or organizations: Not on file    Relationship status: Not on file  Other Topics Concern  . Not on file  Social History Narrative  . Not on file    Outpatient Encounter Medications as of 03/10/2018  Medication Sig  . ibuprofen (ADVIL,MOTRIN) 200 MG tablet Take 200 mg by mouth every 6 (six) hours as needed.  . Norgestimate-Ethinyl Estradiol Triphasic (ORTHO TRI-CYCLEN, 28,) 0.18/0.215/0.25 MG-35 MCG tablet Take 1 tablet by mouth daily.   No facility-administered encounter medications on file as of 03/10/2018.     Review of Systems  Constitutional: Negative for appetite change and unexpected weight change.  HENT: Negative for congestion and sinus pressure.   Respiratory: Negative for chest tightness and shortness of breath.   Cardiovascular: Negative for chest pain and leg swelling.  Gastrointestinal: Negative for abdominal pain, diarrhea, nausea and vomiting.  Genitourinary: Negative for difficulty urinating and dysuria.  Musculoskeletal: Negative for joint swelling and myalgias.  Skin: Negative for color change and rash.  Neurological: Negative for dizziness, light-headedness and headaches.  Psychiatric/Behavioral: Negative  for agitation and dysphoric mood.       Objective:    Physical Exam  Constitutional: She appears well-developed and well-nourished. No distress.  HENT:  Nose: Nose normal.  Mouth/Throat: Oropharynx is clear and moist.  Neck: Neck supple. No thyromegaly present.  Cardiovascular: Normal rate and regular rhythm.  Pulmonary/Chest: Breath sounds normal. No respiratory distress. She has no wheezes.  Abdominal: Soft. Bowel sounds are normal. There is no tenderness.  Musculoskeletal: She exhibits no edema or tenderness.  Lymphadenopathy:    She has no cervical adenopathy.  Skin: No rash noted. No erythema.  Psychiatric: She has a normal mood and affect. Her behavior is normal.    BP 126/86 (BP Location: Left Arm, Patient Position: Sitting, Cuff  Size: Normal)   Pulse 100   Temp 98.4 F (36.9 C) (Oral)   Resp 18   Ht 5\' 2"  (1.575 m)   Wt 140 lb 9.6 oz (63.8 kg)   SpO2 94%   BMI 25.72 kg/m  Wt Readings from Last 3 Encounters:  03/10/18 140 lb 9.6 oz (63.8 kg)  01/03/18 144 lb 9.6 oz (65.6 kg)  08/30/17 145 lb (65.8 kg)     Lab Results  Component Value Date   WBC 9.2 11/07/2015   HGB 13.4 11/07/2015   HCT 39.5 11/07/2015   PLT 323.0 11/07/2015   GLUCOSE 97 11/07/2015   CHOL 179 11/07/2015   TRIG 103.0 11/07/2015   HDL 61.00 11/07/2015   LDLCALC 98 11/07/2015   ALT 10 11/07/2015   AST 11 11/07/2015   NA 138 11/07/2015   K 4.0 11/07/2015   CL 105 11/07/2015   CREATININE 0.78 11/07/2015   BUN 15 11/07/2015   CO2 26 11/07/2015   TSH 1.82 11/07/2015    Mm Digital Screening  Result Date: 09/20/2017 CLINICAL DATA:  Screening. EXAM: DIGITAL SCREENING BILATERAL MAMMOGRAM WITH CAD COMPARISON:  Previous exam(s). ACR Breast Density Category c: The breast tissue is heterogeneously dense, which may obscure small masses. FINDINGS: There are no findings suspicious for malignancy. Images were processed with CAD. IMPRESSION: No mammographic evidence of malignancy. A result letter of this screening mammogram will be mailed directly to the patient. RECOMMENDATION: Screening mammogram in one year. (Code:SM-B-01Y) BI-RADS CATEGORY  1: Negative. Electronically Signed   By: Ted Mcalpineobrinka  Dimitrova M.D.   On: 09/20/2017 17:25       Assessment & Plan:   Problem List Items Addressed This Visit    Elevated blood pressure reading    Blood pressure as outlined.  Will stop ocp's and follow pressures.  We had discussed seeing gyn to discussion birth control options.  Since stopping will hold on gyn appt.        Stress    Handling stress.  Overall doing well.  Follow.       Thyroid fullness    Evaluated by Dr Tedd SiasSolum 04/2017.  Had f/u ultrasound.  Continue f/u with endocrinology.            Dale DurhamSCOTT, Aleicia Kenagy, MD

## 2018-03-13 ENCOUNTER — Encounter: Payer: Self-pay | Admitting: Internal Medicine

## 2018-03-13 NOTE — Assessment & Plan Note (Signed)
Evaluated by Dr Tedd SiasSolum 04/2017.  Had f/u ultrasound.  Continue f/u with endocrinology.

## 2018-03-13 NOTE — Assessment & Plan Note (Signed)
Handling stress.  Overall doing well.  Follow.   

## 2018-03-13 NOTE — Assessment & Plan Note (Signed)
Blood pressure as outlined.  Will stop ocp's and follow pressures.  We had discussed seeing gyn to discussion birth control options.  Since stopping will hold on gyn appt.

## 2018-03-31 ENCOUNTER — Other Ambulatory Visit: Payer: Self-pay | Admitting: Internal Medicine

## 2018-06-21 IMAGING — MG MM DIGITAL SCREENING BILAT W/ CAD
4 series · 4 of 4 positions shown · non-contrast
Comparison: Previous exam(s).

CLINICAL DATA: Screening.

EXAM:
DIGITAL SCREENING BILATERAL MAMMOGRAM WITH CAD

[L MLO]
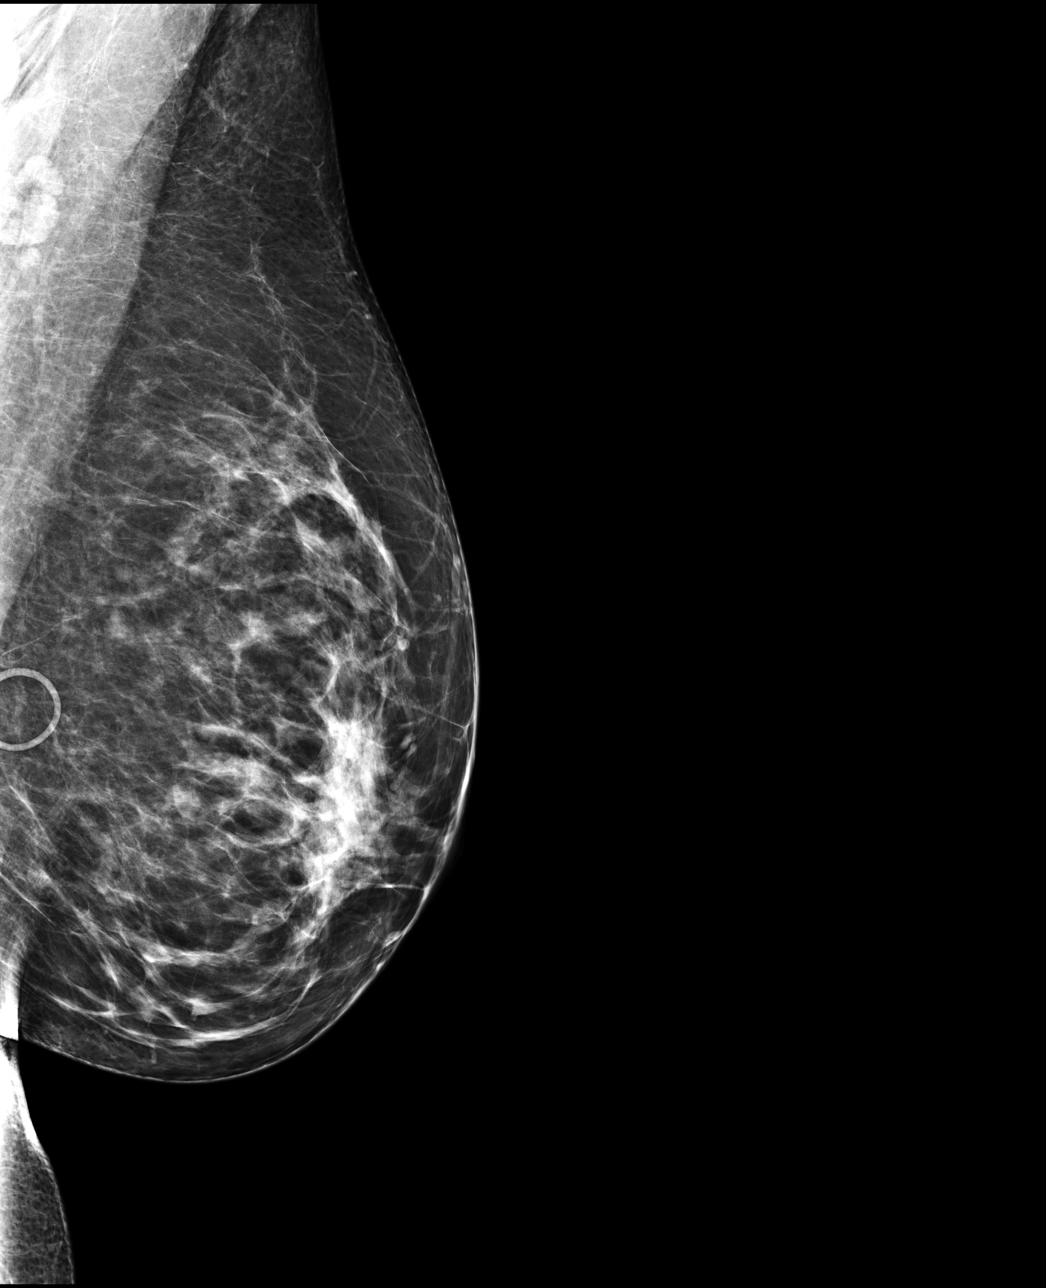

[R MLO]
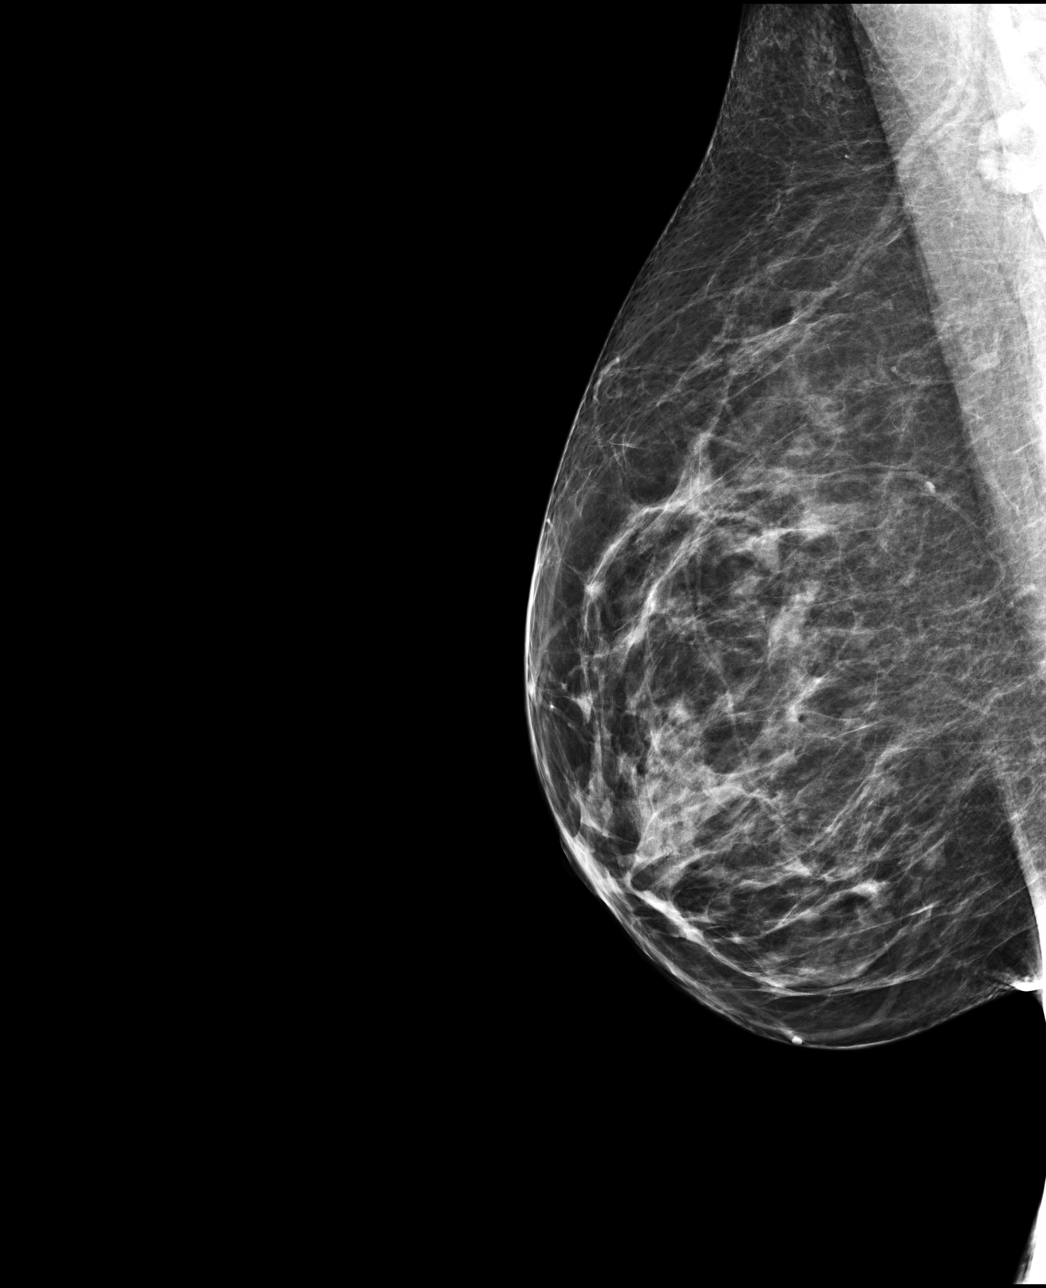

[L CC]
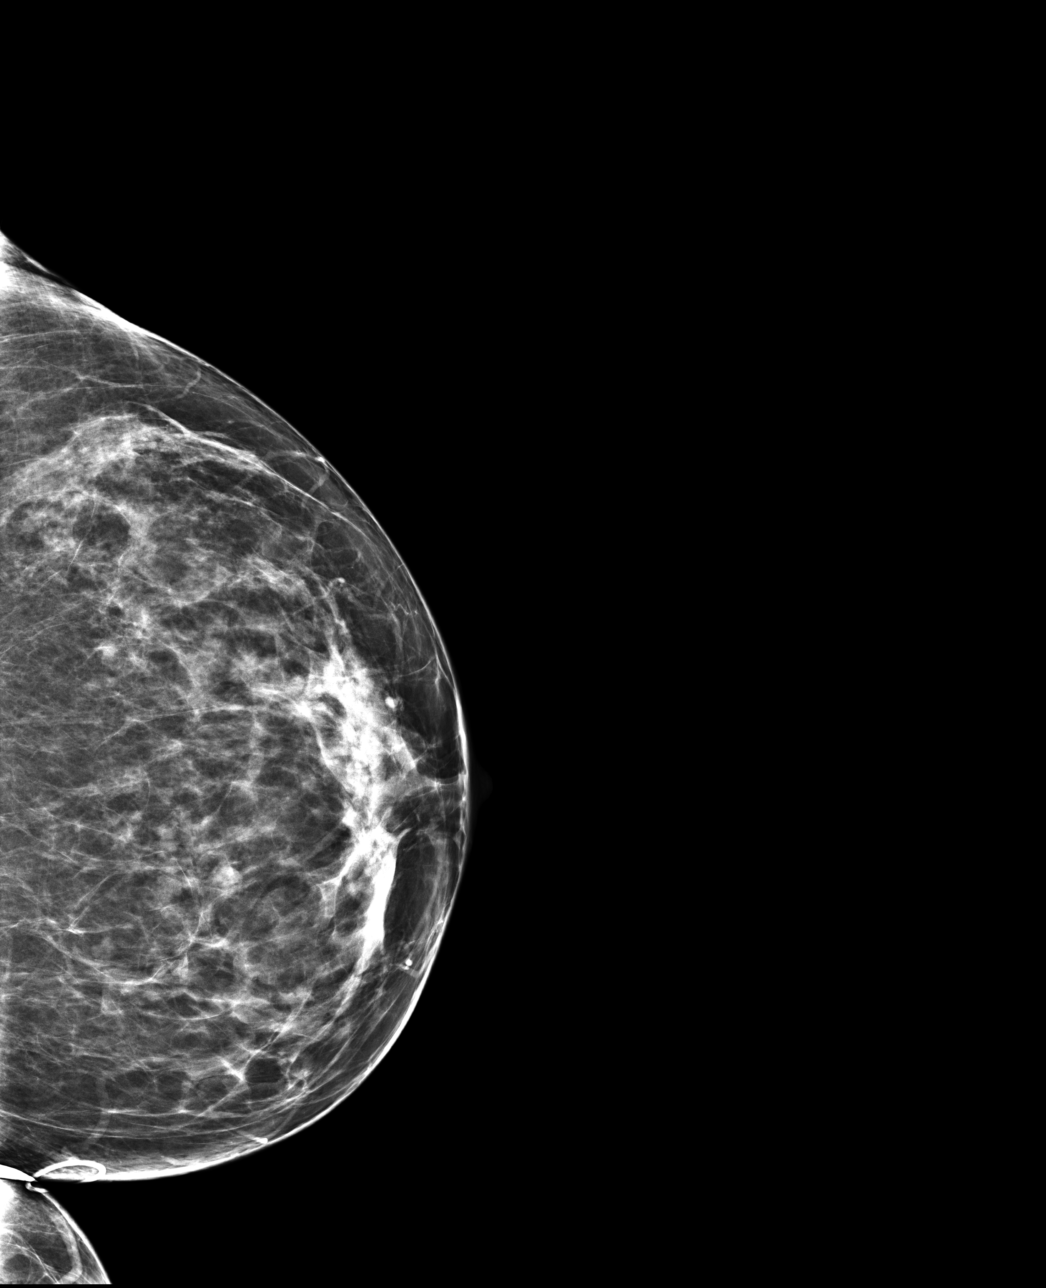

[R CC]
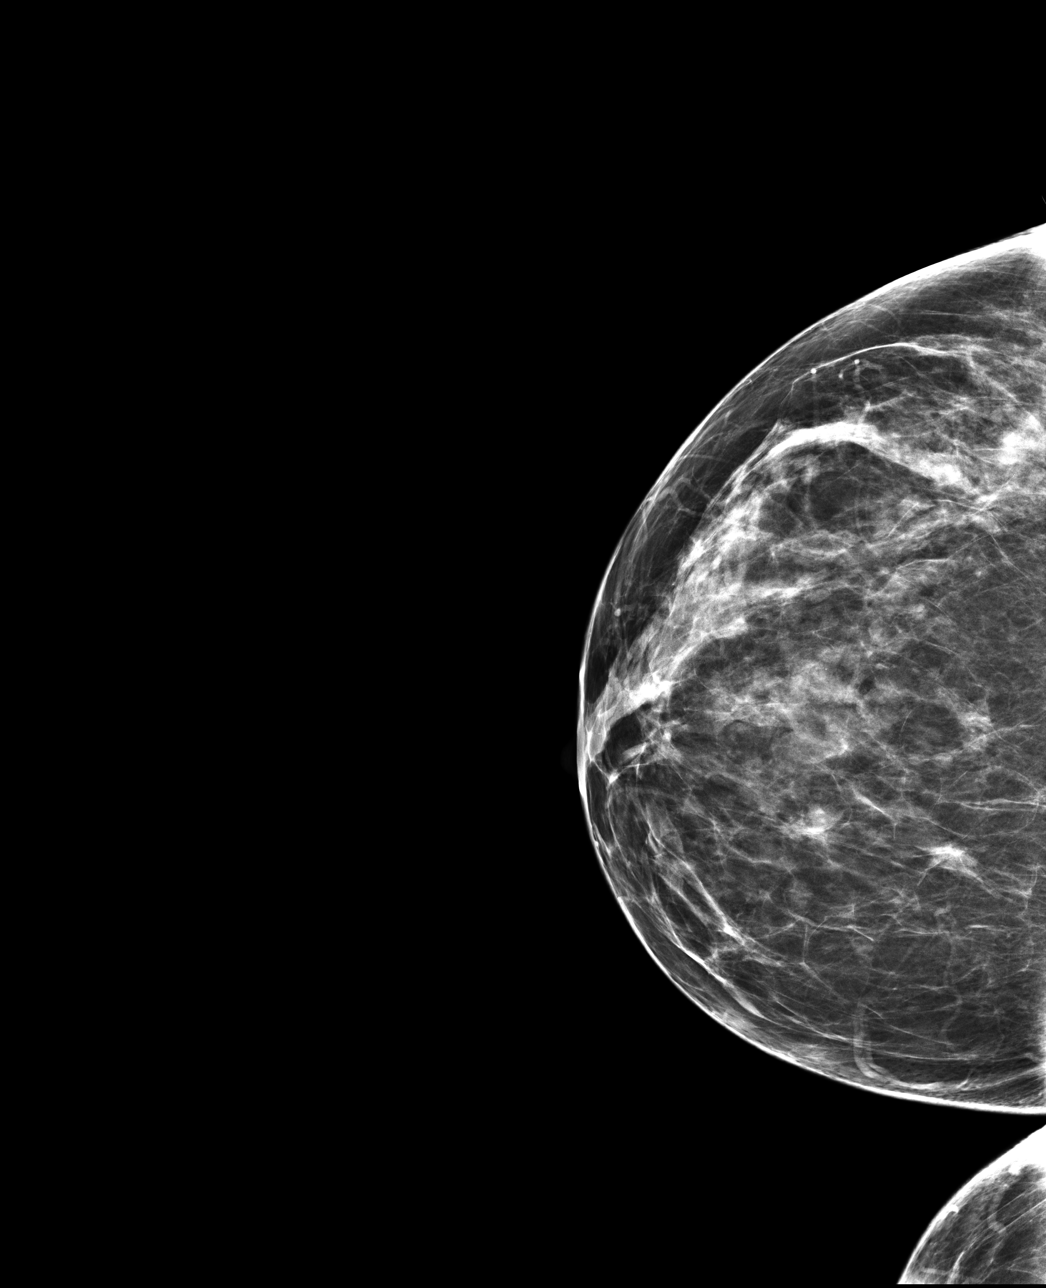

[4 of 4 positions shown; findings below may reference images not displayed]

ACR Breast Density Category c: The breast tissue is heterogeneously
dense, which may obscure small masses.
FINDINGS: There are no findings suspicious for malignancy. Images were
processed with CAD.
IMPRESSION: No mammographic evidence of malignancy. A result letter of this
screening mammogram will be mailed directly to the patient.

RECOMMENDATION:
Screening mammogram in one year. (Code:YJ-2-FEZ)

BI-RADS CATEGORY  1: Negative.

## 2018-07-18 ENCOUNTER — Ambulatory Visit (INDEPENDENT_AMBULATORY_CARE_PROVIDER_SITE_OTHER): Payer: BLUE CROSS/BLUE SHIELD | Admitting: Internal Medicine

## 2018-07-18 ENCOUNTER — Encounter: Payer: Self-pay | Admitting: Internal Medicine

## 2018-07-18 VITALS — BP 126/82 | HR 95 | Temp 98.1°F | Ht 62.0 in | Wt 145.2 lb

## 2018-07-18 DIAGNOSIS — R030 Elevated blood-pressure reading, without diagnosis of hypertension: Secondary | ICD-10-CM

## 2018-07-18 DIAGNOSIS — Z1239 Encounter for other screening for malignant neoplasm of breast: Secondary | ICD-10-CM

## 2018-07-18 DIAGNOSIS — F439 Reaction to severe stress, unspecified: Secondary | ICD-10-CM

## 2018-07-18 DIAGNOSIS — E0789 Other specified disorders of thyroid: Secondary | ICD-10-CM

## 2018-07-18 NOTE — Progress Notes (Signed)
Patient ID: Fiana Gladu, female   DOB: 12/19/1975, 42 y.o.   MRN: [id number]   Subjective:    Patient ID: Giordana Weinheimer, female    DOB: Jul 22, 1976, 42 y.o.   MRN: [id number]  HPI  Patient here for a scheduled follow up.  She reports she is doing well.  Feels good.  Off ocp's.  States had a period 2 weeks ago.  Lasted for 3 days.  No spotting since.  Trying to stay active.  No chest pain.  No sob.  No acid reflux.  No abdominal pain.  Bowels moving.  Handling stress.    Past Medical History:  Diagnosis Date  . Chicken pox as a child   Past Surgical History:  Procedure Laterality Date  . FOOT SURGERY  1995   bone removed from left foot   Family History  Problem Relation Age of Onset  . Hypertension Mother   . Hypercholesterolemia Mother   . Stroke Maternal Grandfather   . Brain cancer Paternal Grandfather   . Breast cancer Neg Hx   . Colon cancer Neg Hx    Social History   Socioeconomic History  . Marital status: Single    Spouse name: Not on file  . Number of children: 0  . Years of education: Not on file  . Highest education level: Not on file  Occupational History  . Not on file  Social Needs  . Financial resource strain: Not on file  . Food insecurity:    Worry: Not on file    Inability: Not on file  . Transportation needs:    Medical: Not on file    Non-medical: Not on file  Tobacco Use  . Smoking status: Never Smoker  . Smokeless tobacco: Never Used  Substance and Sexual Activity  . Alcohol use: Yes    Alcohol/week: 0.0 standard drinks    Comment: occasional  . Drug use: No  . Sexual activity: Not on file  Lifestyle  . Physical activity:    Days per week: Not on file    Minutes per session: Not on file  . Stress: Not on file  Relationships  . Social connections:    Talks on phone: Not on file    Gets together: Not on file    Attends religious service: Not on file    Active member of club or organization: Not on file    Attends meetings of clubs or  organizations: Not on file    Relationship status: Not on file  Other Topics Concern  . Not on file  Social History Narrative  . Not on file    Outpatient Encounter Medications as of 07/18/2018  Medication Sig  . ibuprofen (ADVIL,MOTRIN) 200 MG tablet Take 200 mg by mouth every 6 (six) hours as needed.  . [DISCONTINUED] TRI-PREVIFEM 0.18/0.215/0.25 MG-35 MCG tablet TAKE 1 TABLET BY MOUTH DAILY.   No facility-administered encounter medications on file as of 07/18/2018.     Review of Systems  Constitutional: Negative for appetite change and unexpected weight change.  HENT: Negative for congestion and sinus pressure.   Respiratory: Negative for cough, chest tightness and shortness of breath.   Cardiovascular: Negative for chest pain, palpitations and leg swelling.  Gastrointestinal: Negative for abdominal pain, diarrhea, nausea and vomiting.  Genitourinary: Negative for difficulty urinating and dysuria.  Musculoskeletal: Negative for joint swelling and myalgias.  Skin: Negative for color change and rash.  Neurological: Negative for dizziness, light-headedness and headaches.  Psychiatric/Behavioral: Negative for agitation and dysphoric  mood.       Objective:    Physical Exam  Constitutional: She appears well-developed and well-nourished. No distress.  HENT:  Nose: Nose normal.  Mouth/Throat: Oropharynx is clear and moist.  Neck: Neck supple. No thyromegaly present.  Cardiovascular: Normal rate and regular rhythm.  Pulmonary/Chest: Breath sounds normal. No respiratory distress. She has no wheezes.  Abdominal: Soft. Bowel sounds are normal. There is no tenderness.  Musculoskeletal: She exhibits no edema or tenderness.  Lymphadenopathy:    She has no cervical adenopathy.  Skin: No rash noted. No erythema.  Psychiatric: She has a normal mood and affect. Her behavior is normal.    BP 126/82   Pulse 95   Temp 98.1 F (36.7 C) (Oral)   Ht 5\' 2"  (1.575 m)   Wt 145 lb 3.2 oz  (65.9 kg)   SpO2 93%   BMI 26.56 kg/m  Wt Readings from Last 3 Encounters:  07/18/18 145 lb 3.2 oz (65.9 kg)  03/10/18 140 lb 9.6 oz (63.8 kg)  01/03/18 144 lb 9.6 oz (65.6 kg)     Lab Results  Component Value Date   WBC 9.2 11/07/2015   HGB 13.4 11/07/2015   HCT 39.5 11/07/2015   PLT 323.0 11/07/2015   GLUCOSE 97 11/07/2015   CHOL 179 11/07/2015   TRIG 103.0 11/07/2015   HDL 61.00 11/07/2015   LDLCALC 98 11/07/2015   ALT 10 11/07/2015   AST 11 11/07/2015   NA 138 11/07/2015   K 4.0 11/07/2015   CL 105 11/07/2015   CREATININE 0.78 11/07/2015   BUN 15 11/07/2015   CO2 26 11/07/2015   TSH 1.82 11/07/2015    Mm Digital Screening  Result Date: 09/20/2017 CLINICAL DATA:  Screening. EXAM: DIGITAL SCREENING BILATERAL MAMMOGRAM WITH CAD COMPARISON:  Previous exam(s). ACR Breast Density Category c: The breast tissue is heterogeneously dense, which may obscure small masses. FINDINGS: There are no findings suspicious for malignancy. Images were processed with CAD. IMPRESSION: No mammographic evidence of malignancy. A result letter of this screening mammogram will be mailed directly to the patient. RECOMMENDATION: Screening mammogram in one year. (Code:SM-B-01Y) BI-RADS CATEGORY  1: Negative. Electronically Signed   By: Ted Mcalpine M.D.   On: 09/20/2017 17:25       Assessment & Plan:   Problem List Items Addressed This Visit    Elevated blood pressure reading    Off ocp's.  Blood pressure doing well.  Follow.        Stress    Overall handling stress well.  Follow.        Thyroid fullness    Saw endocrinology.  States was told did not need f/u.  Reviewed records.  Made mention of continued surveillance.  Will discuss f/u with her at next visit.         Other Visit Diagnoses    Breast cancer screening    -  Primary   Relevant Orders   MM 3D SCREEN BREAST BILATERAL       Dale [place], MD

## 2018-07-18 NOTE — Progress Notes (Signed)
Pre visit review using our clinic review tool, if applicable. No additional management support is needed unless otherwise documented below in the visit note. 

## 2018-07-25 NOTE — Assessment & Plan Note (Signed)
Saw endocrinology.  States was told did not need f/u.  Reviewed records.  Made mention of continued surveillance.  Will discuss f/u with her at next visit.

## 2018-07-25 NOTE — Assessment & Plan Note (Signed)
Overall handling stress well.  Follow.  

## 2018-07-25 NOTE — Assessment & Plan Note (Signed)
Off ocp's.  Blood pressure doing well.  Follow.

## 2018-09-19 ENCOUNTER — Ambulatory Visit
Admission: RE | Admit: 2018-09-19 | Discharge: 2018-09-19 | Disposition: A | Discharge status: Home / Self Care | Payer: BLUE CROSS/BLUE SHIELD | Source: Ambulatory Visit | Attending: Internal Medicine | Admitting: Internal Medicine

## 2018-09-19 DIAGNOSIS — Z1231 Encounter for screening mammogram for malignant neoplasm of breast: Secondary | ICD-10-CM | POA: Diagnosis not present

## 2018-09-19 DIAGNOSIS — Z1239 Encounter for other screening for malignant neoplasm of breast: Secondary | ICD-10-CM | POA: Diagnosis not present

## 2019-01-19 ENCOUNTER — Ambulatory Visit (INDEPENDENT_AMBULATORY_CARE_PROVIDER_SITE_OTHER): Payer: BLUE CROSS/BLUE SHIELD | Admitting: Internal Medicine

## 2019-01-19 ENCOUNTER — Encounter: Payer: Self-pay | Admitting: Internal Medicine

## 2019-01-19 ENCOUNTER — Other Ambulatory Visit: Payer: Self-pay

## 2019-01-19 DIAGNOSIS — E0789 Other specified disorders of thyroid: Secondary | ICD-10-CM

## 2019-01-19 DIAGNOSIS — R030 Elevated blood-pressure reading, without diagnosis of hypertension: Secondary | ICD-10-CM | POA: Diagnosis not present

## 2019-01-19 DIAGNOSIS — F439 Reaction to severe stress, unspecified: Secondary | ICD-10-CM

## 2019-01-19 DIAGNOSIS — Z1239 Encounter for other screening for malignant neoplasm of breast: Secondary | ICD-10-CM

## 2019-01-19 NOTE — Progress Notes (Addendum)
Patient ID: Stephanie GillisJessica Graves, female   DOB: 1976-09-22, 43 y.o.   MRN: [id number] Virtual Visit via Video Note  This visit type was conducted due to national recommendations for restrictions regarding the COVID-19 pandemic (e.g. social distancing).  This format is felt to be most appropriate for this patient at this time.  All issues noted in this document were discussed and addressed.  No physical exam was performed (except for noted visual exam findings with Video Visits).   I connected with Stephanie Graves on 01/19/19 at  9:00 AM EDT by a video enabled telemedicine application.  Verified that I am speaking with the correct person using two identifiers. Location patient: home Location provider: work  Persons participating in the virtual visit: patient and provider  I discussed the limitations, risks, security and privacy concerns of performing an evaluation and management service by video and the availability of in person appointments.. The patient expressed understanding and agreed to proceed.   Reason for visit: scheduled follow up.    HPI: She reports she is doing well.  Staying home.  Working from home.  No fever.  No chest congestion.  No sob.  Not exercising as much.  Plans to start.  Has not been checking her blood pressure.  Plans to start monitoring.  Off oral contraceptives.  No chest pain.  No acid reflux.  No abdominal pain.  Bowels moving.  No urine change.  Handling stress.  States no further w/up warranted for thyroid.     ROS: See pertinent positives and negatives per HPI.  Past Medical History:  Diagnosis Date  . Chicken pox as a child    Past Surgical History:  Procedure Laterality Date  . FOOT SURGERY  1995   bone removed from left foot    Family History  Problem Relation Age of Onset  . Hypertension Mother   . Hypercholesterolemia Mother   . Stroke Maternal Grandfather   . Brain cancer Paternal Grandfather   . Breast cancer Neg Hx   . Colon cancer Neg Hx      SOCIAL HX: reviewed.    Current Outpatient Medications:  .  ibuprofen (ADVIL,MOTRIN) 200 MG tablet, Take 200 mg by mouth every 6 (six) hours as needed., Disp: , Rfl:   EXAM:  GENERAL: alert, oriented, appears well and in no acute distress  HEENT: atraumatic, conjunttiva clear, no obvious abnormalities on inspection of external nose and ears  NECK: normal movements of the head and neck  LUNGS: on inspection no signs of respiratory distress, breathing rate appears normal, no obvious gross SOB, gasping or wheezing  CV: no obvious cyanosis  PSYCH/NEURO: pleasant and cooperative, no obvious depression or anxiety, speech and thought processing grossly intact  ASSESSMENT AND PLAN:  Discussed the following assessment and plan:  Breast cancer screening - Plan: MM 3D SCREEN BREAST BILATERAL  Elevated blood pressure reading  Stress  Thyroid fullness  Elevated blood pressure reading Off oral contraceptives.  Has not been monitoring her blood pressure, but will start.  Feels has been doing well.  Follow.    Stress Overall handling stress.  Follow.    Thyroid fullness Saw endocrinology.  States was told no further w/up warranted.  Follow.      I discussed the assessment and treatment plan with the patient. The patient was provided an opportunity to ask questions and all were answered. The patient agreed with the plan and demonstrated an understanding of the instructions.   The patient was advised to call back  or seek an in-person evaluation if the symptoms worsen or if the condition fails to improve as anticipated.    Dale Springer, MD

## 2019-01-21 ENCOUNTER — Encounter: Payer: Self-pay | Admitting: Internal Medicine

## 2019-01-21 NOTE — Assessment & Plan Note (Signed)
Overall handling stress.  Follow.   

## 2019-01-21 NOTE — Assessment & Plan Note (Signed)
Saw endocrinology.  States was told no further w/up warranted.  Follow.

## 2019-01-21 NOTE — Assessment & Plan Note (Signed)
Off oral contraceptives.  Has not been monitoring her blood pressure, but will start.  Feels has been doing well.  Follow.

## 2019-01-24 ENCOUNTER — Encounter: Payer: Self-pay | Admitting: Internal Medicine

## 2019-02-22 ENCOUNTER — Encounter: Payer: Self-pay | Admitting: Internal Medicine

## 2019-06-02 ENCOUNTER — Other Ambulatory Visit: Payer: Self-pay

## 2019-06-06 ENCOUNTER — Ambulatory Visit (INDEPENDENT_AMBULATORY_CARE_PROVIDER_SITE_OTHER): Payer: 59 | Admitting: Internal Medicine

## 2019-06-06 ENCOUNTER — Other Ambulatory Visit: Payer: Self-pay

## 2019-06-06 ENCOUNTER — Encounter: Payer: Self-pay | Admitting: Internal Medicine

## 2019-06-06 VITALS — BP 138/80 | HR 84 | Temp 97.8°F | Resp 16 | Ht 62.0 in | Wt 155.0 lb

## 2019-06-06 DIAGNOSIS — I10 Essential (primary) hypertension: Secondary | ICD-10-CM | POA: Insufficient documentation

## 2019-06-06 DIAGNOSIS — R030 Elevated blood-pressure reading, without diagnosis of hypertension: Secondary | ICD-10-CM

## 2019-06-06 DIAGNOSIS — Z23 Encounter for immunization: Secondary | ICD-10-CM | POA: Diagnosis not present

## 2019-06-06 DIAGNOSIS — E0789 Other specified disorders of thyroid: Secondary | ICD-10-CM | POA: Diagnosis not present

## 2019-06-06 DIAGNOSIS — Z0000 Encounter for general adult medical examination without abnormal findings: Secondary | ICD-10-CM | POA: Diagnosis not present

## 2019-06-06 MED ORDER — AMLODIPINE BESYLATE 2.5 MG PO TABS: 2.5000 mg | ORAL_TABLET | Freq: Every day | ORAL | 2 refills | Status: DC

## 2019-06-06 NOTE — Assessment & Plan Note (Signed)
Physical today 06/06/19.  PAP 12/31/16 - negative with negative HPV.  Mammogram 09/19/18 - birads I.

## 2019-06-06 NOTE — Progress Notes (Signed)
Patient ID: Stephanie GillisJessica Graves, female   DOB: [birth year], 43 y.o.   MRN: [id number]   Subjective:    Patient ID: Stephanie GillisJessica Graves, female    DOB: [birth year], 43 y.o.   MRN: [id number]  HPI  Patient here for her physical exam.  States she is doing well.  Feels good.  Trying to stay active.  Not exercising as much lately.  Discussed diet and exercise.  No chest pain.  No sob.  No acid reflux.  No abdominal pain.  Bowels moving.  No urine change.  Handling stress.  Discussed endocrinology follow up for thyroid fullness.     Past Medical History:  Diagnosis Date  . Chicken pox as a child   Past Surgical History:  Procedure Laterality Date  . FOOT SURGERY  1995   bone removed from left foot   Family History  Problem Relation Age of Onset  . Hypertension Mother   . Hypercholesterolemia Mother   . Stroke Maternal Grandfather   . Brain cancer Paternal Grandfather   . Breast cancer Neg Hx   . Colon cancer Neg Hx    Social History   Socioeconomic History  . Marital status: Single    Spouse name: Not on file  . Number of children: 0  . Years of education: Not on file  . Highest education level: Not on file  Occupational History  . Not on file  Social Needs  . Financial resource strain: Not on file  . Food insecurity    Worry: Not on file    Inability: Not on file  . Transportation needs    Medical: Not on file    Non-medical: Not on file  Tobacco Use  . Smoking status: Never Smoker  . Smokeless tobacco: Never Used  Substance and Sexual Activity  . Alcohol use: Yes    Alcohol/week: 0.0 standard drinks    Comment: occasional  . Drug use: No  . Sexual activity: Not on file  Lifestyle  . Physical activity    Days per week: Not on file    Minutes per session: Not on file  . Stress: Not on file  Relationships  . Social Musicianconnections    Talks on phone: Not on file    Gets together: Not on file    Attends religious service: Not on file    Active member of club or organization: Not  on file    Attends meetings of clubs or organizations: Not on file    Relationship status: Not on file  Other Topics Concern  . Not on file  Social History Narrative  . Not on file    Outpatient Encounter Medications as of 06/06/2019  Medication Sig  . amLODipine (NORVASC) 2.5 MG tablet Take 1 tablet (2.5 mg total) by mouth daily.  Marland Kitchen. ibuprofen (ADVIL,MOTRIN) 200 MG tablet Take 200 mg by mouth every 6 (six) hours as needed.   No facility-administered encounter medications on file as of 06/06/2019.     Review of Systems  Constitutional: Negative for appetite change and unexpected weight change.  HENT: Negative for congestion and sinus pressure.   Eyes: Negative for pain and visual disturbance.  Respiratory: Negative for cough, chest tightness and shortness of breath.   Cardiovascular: Negative for chest pain, palpitations and leg swelling.  Gastrointestinal: Negative for abdominal pain, diarrhea, nausea and vomiting.  Genitourinary: Negative for difficulty urinating and dysuria.  Musculoskeletal: Negative for joint swelling and myalgias.  Skin: Negative for color change and rash.  Neurological: Negative  for dizziness, light-headedness and headaches.  Hematological: Negative for adenopathy. Does not bruise/bleed easily.  Psychiatric/Behavioral: Negative for agitation and dysphoric mood.       Objective:    Physical Exam Constitutional:      General: She is not in acute distress.    Appearance: Normal appearance. She is well-developed.  HENT:     Right Ear: External ear normal.     Left Ear: External ear normal.  Eyes:     General: No scleral icterus.       Right eye: No discharge.        Left eye: No discharge.     Conjunctiva/sclera: Conjunctivae normal.  Neck:     Musculoskeletal: Neck supple. No muscular tenderness.     Thyroid: No thyromegaly.  Cardiovascular:     Rate and Rhythm: Normal rate and regular rhythm.  Pulmonary:     Effort: No tachypnea, accessory muscle  usage or respiratory distress.     Breath sounds: Normal breath sounds. No decreased breath sounds or wheezing.  Chest:     Breasts:        Right: No inverted nipple, mass, nipple discharge or tenderness (no axillary adenopathy).        Left: No inverted nipple, mass, nipple discharge or tenderness (no axilarry adenopathy).  Abdominal:     General: Bowel sounds are normal.     Palpations: Abdomen is soft.     Tenderness: There is no abdominal tenderness.  Musculoskeletal:        General: No swelling or tenderness.  Lymphadenopathy:     Cervical: No cervical adenopathy.  Skin:    Findings: No erythema or rash.  Neurological:     Mental Status: She is alert and oriented to person, place, and time.  Psychiatric:        Mood and Affect: Mood normal.        Behavior: Behavior normal.     BP 138/80   Pulse 84   Temp 97.8 F (36.6 C)   Resp 16   Ht 5\' 2"  (1.575 m)   Wt 155 lb (70.3 kg)   SpO2 98%   BMI 28.35 kg/m  Wt Readings from Last 3 Encounters:  06/06/19 155 lb (70.3 kg)  07/18/18 145 lb 3.2 oz (65.9 kg)  03/10/18 140 lb 9.6 oz (63.8 kg)     Lab Results  Component Value Date   WBC 9.2 11/07/2015   HGB 13.4 11/07/2015   HCT 39.5 11/07/2015   PLT 323.0 11/07/2015   GLUCOSE 97 11/07/2015   CHOL 179 11/07/2015   TRIG 103.0 11/07/2015   HDL 61.00 11/07/2015   LDLCALC 98 11/07/2015   ALT 10 11/07/2015   AST 11 11/07/2015   NA 138 11/07/2015   K 4.0 11/07/2015   CL 105 11/07/2015   CREATININE 0.78 11/07/2015   BUN 15 11/07/2015   CO2 26 11/07/2015   TSH 1.82 11/07/2015    Mm 3d Screen Breast Bilateral  Result Date: 09/19/2018 CLINICAL DATA:  Screening. EXAM: DIGITAL SCREENING BILATERAL MAMMOGRAM WITH TOMO AND CAD COMPARISON:  Previous exam(s). ACR Breast Density Category b: There are scattered areas of fibroglandular density. FINDINGS: There are no findings suspicious for malignancy. Images were processed with CAD. IMPRESSION: No mammographic evidence of  malignancy. A result letter of this screening mammogram will be mailed directly to the patient. RECOMMENDATION: Screening mammogram in one year. (Code:SM-B-01Y) BI-RADS CATEGORY  1: Negative. Electronically Signed   By: Elberta Fortis M.D.   On:  09/19/2018 16:31       Assessment & Plan:   Problem List Items Addressed This Visit    Elevated blood pressure reading    Off oral contraceptives.  Elevated today.  Recheck slightly improved.  Have her spot check her pressure.  Send in readings.        Essential hypertension    Blood pressure as outlined.  Have her spot check her pressure.  Send in readings.        Relevant Medications   amLODipine (NORVASC) 2.5 MG tablet   Health care maintenance    Physical today 06/06/19.  PAP 12/31/16 - negative with negative HPV.  Mammogram 09/19/18 - birads I.        Thyroid fullness    Saw endocrinology.  Recommended f/u.  Discussed with her today.  Refer back to endocrinology.        Relevant Orders   Ambulatory referral to Endocrinology    Other Visit Diagnoses    Routine general medical examination at a health care facility    -  Primary   Need for immunization against influenza       Relevant Orders   Flu Vaccine QUAD 36+ mos IM (Completed)       Einar Pheasant, MD

## 2019-06-11 ENCOUNTER — Encounter: Payer: Self-pay | Admitting: Internal Medicine

## 2019-06-11 NOTE — Assessment & Plan Note (Signed)
Saw endocrinology.  Recommended f/u.  Discussed with her today.  Refer back to endocrinology.

## 2019-06-11 NOTE — Assessment & Plan Note (Signed)
Off oral contraceptives.  Elevated today.  Recheck slightly improved.  Have her spot check her pressure.  Send in readings.

## 2019-06-11 NOTE — Assessment & Plan Note (Signed)
Blood pressure as outlined.  Have her spot check her pressure.  Send in readings.

## 2019-06-16 DIAGNOSIS — E041 Nontoxic single thyroid nodule: Secondary | ICD-10-CM | POA: Diagnosis not present

## 2019-06-21 DIAGNOSIS — E041 Nontoxic single thyroid nodule: Secondary | ICD-10-CM | POA: Diagnosis not present

## 2019-07-12 ENCOUNTER — Other Ambulatory Visit: Payer: Self-pay

## 2019-07-12 ENCOUNTER — Ambulatory Visit: Payer: 59

## 2019-07-12 DIAGNOSIS — Z0000 Encounter for general adult medical examination without abnormal findings: Secondary | ICD-10-CM

## 2019-07-13 LAB — CMP12+LP+TP+TSH+6AC+CBC/D/PLT
ALT: 22 IU/L (ref 0–32)
AST: 19 IU/L (ref 0–40)
Albumin/Globulin Ratio: 2 (ref 1.2–2.2)
Albumin: 4.4 g/dL (ref 3.8–4.8)
Alkaline Phosphatase: 102 IU/L (ref 39–117)
BUN/Creatinine Ratio: 21 (ref 9–23)
BUN: 14 mg/dL (ref 6–24)
Basophils Absolute: 0.1 10*3/uL (ref 0.0–0.2)
Basos: 1 %
Bilirubin Total: 0.2 mg/dL (ref 0.0–1.2)
Calcium: 9 mg/dL (ref 8.7–10.2)
Chloride: 103 mmol/L (ref 96–106)
Chol/HDL Ratio: 4.5 ratio — ABNORMAL HIGH (ref 0.0–4.4)
Cholesterol, Total: 208 mg/dL — ABNORMAL HIGH (ref 100–199)
Creatinine, Ser: 0.67 mg/dL (ref 0.57–1.00)
EOS (ABSOLUTE): 0.2 10*3/uL (ref 0.0–0.4)
Eos: 4 %
Estimated CHD Risk: 1.1 times avg. — ABNORMAL HIGH (ref 0.0–1.0)
Free Thyroxine Index: 1.8 (ref 1.2–4.9)
GFR calc Af Amer: 125 mL/min/{1.73_m2} (ref >59)
GFR calc non Af Amer: 109 mL/min/{1.73_m2} (ref >59)
GGT: 23 IU/L (ref 0–60)
Globulin, Total: 2.2 g/dL (ref 1.5–4.5)
Glucose: 98 mg/dL (ref 65–99)
HDL: 46 mg/dL (ref >39)
Hematocrit: 41.5 % (ref 34.0–46.6)
Hemoglobin: 14.2 g/dL (ref 11.1–15.9)
Immature Grans (Abs): 0 10*3/uL (ref 0.0–0.1)
Immature Granulocytes: 0 %
Iron: 103 ug/dL (ref 27–159)
LDH: 168 IU/L (ref 119–226)
LDL Chol Calc (NIH): 142 mg/dL — ABNORMAL HIGH (ref 0–99)
Lymphocytes Absolute: 2.3 10*3/uL (ref 0.7–3.1)
Lymphs: 39 %
MCH: 30.5 pg (ref 26.6–33.0)
MCHC: 34.2 g/dL (ref 31.5–35.7)
MCV: 89 fL (ref 79–97)
Monocytes Absolute: 0.5 10*3/uL (ref 0.1–0.9)
Monocytes: 8 %
Neutrophils Absolute: 2.9 10*3/uL (ref 1.4–7.0)
Neutrophils: 48 %
Phosphorus: 2.6 mg/dL — ABNORMAL LOW (ref 3.0–4.3)
Platelets: 283 10*3/uL (ref 150–450)
Potassium: 4.3 mmol/L (ref 3.5–5.2)
RBC: 4.66 x10E6/uL (ref 3.77–5.28)
RDW: 12.5 % (ref 11.7–15.4)
Sodium: 140 mmol/L (ref 134–144)
T3 Uptake Ratio: 24 % (ref 24–39)
T4, Total: 7.7 ug/dL (ref 4.5–12.0)
TSH: 1.89 u[IU]/mL (ref 0.450–4.500)
Total Protein: 6.6 g/dL (ref 6.0–8.5)
Triglycerides: 113 mg/dL (ref 0–149)
Uric Acid: 4.6 mg/dL (ref 2.5–7.1)
VLDL Cholesterol Cal: 20 mg/dL (ref 5–40)
WBC: 5.9 10*3/uL (ref 3.4–10.8)

## 2019-07-14 ENCOUNTER — Encounter: Payer: Self-pay | Admitting: Internal Medicine

## 2019-07-28 ENCOUNTER — Other Ambulatory Visit: Payer: Self-pay

## 2019-08-01 ENCOUNTER — Other Ambulatory Visit: Payer: Self-pay

## 2019-08-01 ENCOUNTER — Encounter: Payer: Self-pay | Admitting: Internal Medicine

## 2019-08-01 ENCOUNTER — Ambulatory Visit (INDEPENDENT_AMBULATORY_CARE_PROVIDER_SITE_OTHER): Payer: 59 | Admitting: Internal Medicine

## 2019-08-01 DIAGNOSIS — R69 Illness, unspecified: Secondary | ICD-10-CM | POA: Diagnosis not present

## 2019-08-01 DIAGNOSIS — F439 Reaction to severe stress, unspecified: Secondary | ICD-10-CM | POA: Diagnosis not present

## 2019-08-01 DIAGNOSIS — I10 Essential (primary) hypertension: Secondary | ICD-10-CM

## 2019-08-01 DIAGNOSIS — E0789 Other specified disorders of thyroid: Secondary | ICD-10-CM

## 2019-08-01 MED ORDER — AMLODIPINE BESYLATE 2.5 MG PO TABS: 2.5000 mg | ORAL_TABLET | Freq: Every day | ORAL | 1 refills | Status: DC

## 2019-08-01 NOTE — Progress Notes (Signed)
Patient ID: Stephanie GillisJessica Graves, female   DOB: [birth year], 43 y.o.   MRN: [id number]   Subjective:    Patient ID: Stephanie GillisJessica Graves, female    DOB: [birth year], 43 y.o.   MRN: [id number]  HPI  Patient here for a scheduled follow up.  Here to follow up on her blood pressure.  Increased stress at work.  Discussed with her today.  She is not checking her blood pressure.  Denies headache.  No dizziness.  No light headedness.  No chest pain or sob.  No abdominal pain.  Periods regular.     Past Medical History:  Diagnosis Date  . Chicken pox as a child   Past Surgical History:  Procedure Laterality Date  . FOOT SURGERY  1995   bone removed from left foot   Family History  Problem Relation Age of Onset  . Hypertension Mother   . Hypercholesterolemia Mother   . Stroke Maternal Grandfather   . Brain cancer Paternal Grandfather   . Breast cancer Neg Hx   . Colon cancer Neg Hx    Social History   Socioeconomic History  . Marital status: Single    Spouse name: Not on file  . Number of children: 0  . Years of education: Not on file  . Highest education level: Not on file  Occupational History  . Not on file  Social Needs  . Financial resource strain: Not on file  . Food insecurity    Worry: Not on file    Inability: Not on file  . Transportation needs    Medical: Not on file    Non-medical: Not on file  Tobacco Use  . Smoking status: Never Smoker  . Smokeless tobacco: Never Used  Substance and Sexual Activity  . Alcohol use: Yes    Alcohol/week: 0.0 standard drinks    Comment: occasional  . Drug use: No  . Sexual activity: Not on file  Lifestyle  . Physical activity    Days per week: Not on file    Minutes per session: Not on file  . Stress: Not on file  Relationships  . Social Musicianconnections    Talks on phone: Not on file    Gets together: Not on file    Attends religious service: Not on file    Active member of club or organization: Not on file    Attends meetings of clubs or  organizations: Not on file    Relationship status: Not on file  Other Topics Concern  . Not on file  Social History Narrative  . Not on file    Outpatient Encounter Medications as of 08/01/2019  Medication Sig  . amLODipine (NORVASC) 2.5 MG tablet Take 1 tablet (2.5 mg total) by mouth daily.  Marland Kitchen. ibuprofen (ADVIL,MOTRIN) 200 MG tablet Take 200 mg by mouth every 6 (six) hours as needed.  . [DISCONTINUED] amLODipine (NORVASC) 2.5 MG tablet Take 1 tablet (2.5 mg total) by mouth daily.   No facility-administered encounter medications on file as of 08/01/2019.    Review of Systems  Constitutional: Negative for appetite change and unexpected weight change.  HENT: Negative for congestion and sinus pressure.   Respiratory: Negative for cough, chest tightness and shortness of breath.   Cardiovascular: Negative for chest pain, palpitations and leg swelling.  Gastrointestinal: Negative for abdominal pain, diarrhea and nausea.  Genitourinary: Negative for difficulty urinating and dysuria.  Musculoskeletal: Negative for joint swelling and myalgias.  Skin: Negative for color change and rash.  Neurological: Negative  for dizziness, light-headedness and headaches.  Psychiatric/Behavioral: Negative for agitation and dysphoric mood.       Objective:    Physical Exam Constitutional:      General: She is not in acute distress.    Appearance: Normal appearance.  HENT:     Head: Normocephalic and atraumatic.     Right Ear: External ear normal.     Left Ear: External ear normal.  Eyes:     General: No scleral icterus.       Right eye: No discharge.        Left eye: No discharge.     Conjunctiva/sclera: Conjunctivae normal.  Neck:     Musculoskeletal: Neck supple. No muscular tenderness.     Thyroid: No thyromegaly.  Cardiovascular:     Rate and Rhythm: Normal rate and regular rhythm.  Pulmonary:     Effort: No respiratory distress.     Breath sounds: Normal breath sounds. No wheezing.   Abdominal:     General: Bowel sounds are normal.     Palpations: Abdomen is soft.     Tenderness: There is no abdominal tenderness.  Musculoskeletal:        General: No swelling or tenderness.  Lymphadenopathy:     Cervical: No cervical adenopathy.  Skin:    Findings: No erythema or rash.  Neurological:     Mental Status: She is alert.  Psychiatric:        Behavior: Behavior normal.     BP 124/80   Pulse 100   Temp (!) 97.3 F (36.3 C)   Resp 16   Wt 148 lb 6.4 oz (67.3 kg)   SpO2 97%   BMI 27.14 kg/m  Wt Readings from Last 3 Encounters:  08/01/19 148 lb 6.4 oz (67.3 kg)  06/06/19 155 lb (70.3 kg)  07/18/18 145 lb 3.2 oz (65.9 kg)     Lab Results  Component Value Date   WBC 5.9 07/12/2019   HGB 14.2 07/12/2019   HCT 41.5 07/12/2019   PLT 283 07/12/2019   GLUCOSE 98 07/12/2019   CHOL 208 (H) 07/12/2019   TRIG 113 07/12/2019   HDL 46 07/12/2019   LDLCALC 142 (H) 07/12/2019   ALT 22 07/12/2019   AST 19 07/12/2019   NA 140 07/12/2019   K 4.3 07/12/2019   CL 103 07/12/2019   CREATININE 0.67 07/12/2019   BUN 14 07/12/2019   CO2 26 11/07/2015   TSH 1.890 07/12/2019    Mm 3d Screen Breast Bilateral  Result Date: 09/19/2018 CLINICAL DATA:  Screening. EXAM: DIGITAL SCREENING BILATERAL MAMMOGRAM WITH TOMO AND CAD COMPARISON:  Previous exam(s). ACR Breast Density Category b: There are scattered areas of fibroglandular density. FINDINGS: There are no findings suspicious for malignancy. Images were processed with CAD. IMPRESSION: No mammographic evidence of malignancy. A result letter of this screening mammogram will be mailed directly to the patient. RECOMMENDATION: Screening mammogram in one year. (Code:SM-B-01Y) BI-RADS CATEGORY  1: Negative. Electronically Signed   By: Marin Olp M.D.   On: 09/19/2018 16:31       Assessment & Plan:   Problem List Items Addressed This Visit    Essential hypertension    Started amlodipine 2.5mg  last visit.  Have her spot  check her pressure.  Blood pressure recheck by me improved.  Continue current medication.  Follow.        Relevant Medications   amLODipine (NORVASC) 2.5 MG tablet   Stress    Discussed with her today.  Overall she feels she is handling things well.  Has good support.  Does not feel needs any further intervention.        Thyroid fullness    Saw Dr Tedd Sias.  Had f/u ultrasound.  Stable. Note reviewed.  No further f/u warranted.            Dale Springmont, MD

## 2019-08-01 NOTE — Assessment & Plan Note (Signed)
Discussed with her today.  Overall she feels she is handling things well.  Has good support.  Does not feel needs any further intervention.

## 2019-08-01 NOTE — Assessment & Plan Note (Signed)
Saw Dr Gabriel Carina.  Had f/u ultrasound.  Stable. Note reviewed.  No further f/u warranted.

## 2019-08-01 NOTE — Assessment & Plan Note (Signed)
Started amlodipine 2.5mg  last visit.  Have her spot check her pressure.  Blood pressure recheck by me improved.  Continue current medication.  Follow.

## 2019-08-14 ENCOUNTER — Other Ambulatory Visit: Payer: Self-pay

## 2019-08-14 DIAGNOSIS — Z20822 Contact with and (suspected) exposure to covid-19: Secondary | ICD-10-CM

## 2019-08-15 LAB — NOVEL CORONAVIRUS, NAA: SARS-CoV-2, NAA: NOT DETECTED

## 2019-10-10 ENCOUNTER — Ambulatory Visit
Admission: RE | Admit: 2019-10-10 | Discharge: 2019-10-10 | Disposition: A | Discharge status: Home / Self Care | Payer: Managed Care, Other (non HMO) | Source: Ambulatory Visit | Attending: Internal Medicine | Admitting: Internal Medicine

## 2019-10-10 DIAGNOSIS — Z1231 Encounter for screening mammogram for malignant neoplasm of breast: Secondary | ICD-10-CM | POA: Diagnosis not present

## 2019-10-10 DIAGNOSIS — Z1239 Encounter for other screening for malignant neoplasm of breast: Secondary | ICD-10-CM

## 2019-10-11 DIAGNOSIS — H5213 Myopia, bilateral: Secondary | ICD-10-CM | POA: Diagnosis not present

## 2019-12-07 ENCOUNTER — Other Ambulatory Visit: Payer: Self-pay

## 2019-12-07 ENCOUNTER — Telehealth (INDEPENDENT_AMBULATORY_CARE_PROVIDER_SITE_OTHER): Payer: 59 | Admitting: Internal Medicine

## 2019-12-07 DIAGNOSIS — I10 Essential (primary) hypertension: Secondary | ICD-10-CM | POA: Diagnosis not present

## 2019-12-07 DIAGNOSIS — R69 Illness, unspecified: Secondary | ICD-10-CM | POA: Diagnosis not present

## 2019-12-07 DIAGNOSIS — F439 Reaction to severe stress, unspecified: Secondary | ICD-10-CM | POA: Diagnosis not present

## 2019-12-07 NOTE — Progress Notes (Signed)
Patient ID: Stephanie Graves, female   DOB: Dec 26, 1975, 44 y.o.   MRN: [id number]   Virtual Visit via telephone Note  This visit type was conducted due to national recommendations for restrictions regarding the COVID-19 pandemic (e.g. social distancing).  This format is felt to be most appropriate for this patient at this time.  All issues noted in this document were discussed and addressed.  No physical exam was performed (except for noted visual exam findings with Video Visits).   I connected with Stephanie Graves by telephone and verified that I am speaking with the correct person using two identifiers. Location patient: home Location provider: work Persons participating in the virtual visit: patient, provider  The limitations, risks, security and privacy concerns of performing an evaluation and management service by telephone and the availability of in person appointments have been discussed.  The patient expressed understanding and agreed to proceed.   Reason for visit: scheduled follow up  HPI: Reports doing relatively well.  Handling stress.  Trying to stay active.  Is walking.  Bought a treadmill.  Watching her diet.  Eating better.  Decreased sugar intake.  No chest pain or sob reported.  No acid reflux or abdominal pain reported.  Had covid vaccine 11/01/19 (last) - first 10/11/19.  Some headache and fever for 24 hours after last vaccine.  No problems now.  Overall feels things are stable.  Taking amlodipine.     ROS: See pertinent positives and negatives per HPI.  Past Medical History:  Diagnosis Date  . Chicken pox as a child    Past Surgical History:  Procedure Laterality Date  . FOOT SURGERY  1995   bone removed from left foot    Family History  Problem Relation Age of Onset  . Hypertension Mother   . Hypercholesterolemia Mother   . Stroke Maternal Grandfather   . Brain cancer Paternal Grandfather   . Breast cancer Neg Hx   . Colon cancer Neg Hx     SOCIAL HX: reviewed.     Current Outpatient Medications:  .  amLODipine (NORVASC) 2.5 MG tablet, Take 1 tablet (2.5 mg total) by mouth daily., Disp: 90 tablet, Rfl: 1 .  ibuprofen (ADVIL,MOTRIN) 200 MG tablet, Take 200 mg by mouth every 6 (six) hours as needed., Disp: , Rfl:   EXAM:  GENERAL: alert.  Sounds to be in no acute distress.  Answering questions appropriately.    PSYCH/NEURO: pleasant and cooperative, no obvious depression or anxiety, speech and thought processing grossly intact  ASSESSMENT AND PLAN:  Discussed the following assessment and plan:  Essential hypertension On amlodipine.  Tolerating.  Follow pressures.  Follow metabolic panel.    Stress Overall appears to be handling things well.  Follow.     Meds ordered this encounter  Medications  . amLODipine (NORVASC) 2.5 MG tablet    Sig: Take 1 tablet (2.5 mg total) by mouth daily.    Dispense:  90 tablet    Refill:  1     I discussed the assessment and treatment plan with the patient. The patient was provided an opportunity to ask questions and all were answered. The patient agreed with the plan and demonstrated an understanding of the instructions.   The patient was advised to call back or seek an in-person evaluation if the symptoms worsen or if the condition fails to improve as anticipated.  I provided of non-face-to-face time during this encounter.   Dale , MD

## 2019-12-16 ENCOUNTER — Encounter: Payer: Self-pay | Admitting: Internal Medicine

## 2019-12-16 MED ORDER — AMLODIPINE BESYLATE 2.5 MG PO TABS: 2.5000 mg | ORAL_TABLET | Freq: Every day | ORAL | 1 refills | Status: DC

## 2019-12-16 NOTE — Assessment & Plan Note (Signed)
Overall appears to be handling things well.  Follow.  ?

## 2019-12-16 NOTE — Assessment & Plan Note (Signed)
On amlodipine.  Tolerating.  Follow pressures.  Follow metabolic panel.

## 2020-03-28 ENCOUNTER — Telehealth: Payer: Self-pay | Admitting: Internal Medicine

## 2020-03-28 NOTE — Telephone Encounter (Signed)
Pt dropped off physical form to be filled out by Dr. Lorin Picket. Placed form in colored folder for Dr. Lorin Picket in the front office.

## 2020-03-29 NOTE — Telephone Encounter (Signed)
LMTCB

## 2020-04-02 NOTE — Telephone Encounter (Signed)
Form faxed to [phone] per pt request

## 2020-04-02 NOTE — Telephone Encounter (Signed)
This form is from last years physical. Placed in quick sign for signature

## 2020-04-25 ENCOUNTER — Encounter: Payer: Self-pay | Admitting: Internal Medicine

## 2020-04-25 ENCOUNTER — Ambulatory Visit (INDEPENDENT_AMBULATORY_CARE_PROVIDER_SITE_OTHER): Payer: 59 | Admitting: Internal Medicine

## 2020-04-25 ENCOUNTER — Other Ambulatory Visit: Payer: Self-pay

## 2020-04-25 DIAGNOSIS — F439 Reaction to severe stress, unspecified: Secondary | ICD-10-CM | POA: Diagnosis not present

## 2020-04-25 DIAGNOSIS — I10 Essential (primary) hypertension: Secondary | ICD-10-CM | POA: Diagnosis not present

## 2020-04-25 DIAGNOSIS — E0789 Other specified disorders of thyroid: Secondary | ICD-10-CM | POA: Diagnosis not present

## 2020-04-25 DIAGNOSIS — R69 Illness, unspecified: Secondary | ICD-10-CM | POA: Diagnosis not present

## 2020-04-25 NOTE — Assessment & Plan Note (Signed)
Blood pressure rechecked by me:  124/78.  Doing well.  Continue amlodipine.

## 2020-04-25 NOTE — Assessment & Plan Note (Signed)
Saw Dr Solum.  Had f/u ultrasound.  Stable.  Note reviewed.  No further w/up warranted.   

## 2020-04-25 NOTE — Progress Notes (Signed)
Patient ID: Stephanie Graves, female   DOB: 1976/04/05, 44 y.o.   MRN: [id number]   Subjective:    Patient ID: Stephanie Graves, female    DOB: [birth year], 44 y.o.   MRN: [id number]  HPI This visit occurred during the SARS-CoV-2 public health emergency.  Safety protocols were in place, including screening questions prior to the visit, additional usage of staff PPE, and extensive cleaning of exam room while observing appropriate contact time as indicated for disinfecting solutions.  Patient here for a scheduled follow up. She is doing well.  Working.  Handling stress.  Walking.  No chest pain or sob reported. No abdominal pain or bowel change reported.  Gave blood yesterday.  Blood pressure ok when they checked.  Has lost weight.  Watching diet some.  Gets labs through her work.     Past Medical History:  Diagnosis Date  . Chicken pox as a child   Past Surgical History:  Procedure Laterality Date  . FOOT SURGERY  1995   bone removed from left foot   Family History  Problem Relation Age of Onset  . Hypertension Mother   . Hypercholesterolemia Mother   . Stroke Maternal Grandfather   . Brain cancer Paternal Grandfather   . Breast cancer Neg Hx   . Colon cancer Neg Hx    Social History   Socioeconomic History  . Marital status: Single    Spouse name: Not on file  . Number of children: 0  . Years of education: Not on file  . Highest education level: Not on file  Occupational History  . Not on file  Tobacco Use  . Smoking status: Never Smoker  . Smokeless tobacco: Never Used  Substance and Sexual Activity  . Alcohol use: Yes    Alcohol/week: 0.0 standard drinks    Comment: occasional  . Drug use: No  . Sexual activity: Not on file  Other Topics Concern  . Not on file  Social History Narrative  . Not on file   Social Determinants of Health   Financial Resource Strain:   . Difficulty of Paying Living Expenses:   Food Insecurity:   . Worried About Programme researcher, broadcasting/film/video in the  Last Year:   . Barista in the Last Year:   Transportation Needs:   . Freight forwarder (Medical):   Marland Kitchen Lack of Transportation (Non-Medical):   Physical Activity:   . Days of Exercise per Week:   . Minutes of Exercise per Session:   Stress:   . Feeling of Stress :   Social Connections:   . Frequency of Communication with Friends and Family:   . Frequency of Social Gatherings with Friends and Family:   . Attends Religious Services:   . Active Member of Clubs or Organizations:   . Attends Banker Meetings:   Marland Kitchen Marital Status:     Outpatient Encounter Medications as of 04/25/2020  Medication Sig  . amLODipine (NORVASC) 2.5 MG tablet Take 1 tablet (2.5 mg total) by mouth daily.  Marland Kitchen ibuprofen (ADVIL,MOTRIN) 200 MG tablet Take 200 mg by mouth every 6 (six) hours as needed.   No facility-administered encounter medications on file as of 04/25/2020.    Review of Systems  Constitutional: Negative for appetite change and unexpected weight change.  HENT: Negative for congestion and sinus pressure.   Respiratory: Negative for cough, chest tightness and shortness of breath.   Cardiovascular: Negative for chest pain, palpitations and leg swelling.  Gastrointestinal:  Negative for abdominal pain, diarrhea, nausea and vomiting.  Genitourinary: Negative for difficulty urinating and dysuria.  Musculoskeletal: Negative for joint swelling and myalgias.  Skin: Negative for color change and rash.  Neurological: Negative for dizziness, light-headedness and headaches.  Psychiatric/Behavioral: Negative for agitation and dysphoric mood.       Objective:    Physical Exam Vitals reviewed.  Constitutional:      General: She is not in acute distress.    Appearance: Normal appearance.  HENT:     Head: Normocephalic and atraumatic.     Right Ear: External ear normal.     Left Ear: External ear normal.  Eyes:     General: No scleral icterus.       Right eye: No discharge.          Left eye: No discharge.     Conjunctiva/sclera: Conjunctivae normal.  Neck:     Thyroid: No thyromegaly.  Cardiovascular:     Rate and Rhythm: Normal rate and regular rhythm.  Pulmonary:     Effort: No respiratory distress.     Breath sounds: Normal breath sounds. No wheezing.  Abdominal:     General: Bowel sounds are normal.     Palpations: Abdomen is soft.     Tenderness: There is no abdominal tenderness.  Musculoskeletal:        General: No swelling or tenderness.     Cervical back: Neck supple. No tenderness.  Lymphadenopathy:     Cervical: No cervical adenopathy.  Skin:    Findings: No erythema or rash.  Neurological:     Mental Status: She is alert.  Psychiatric:        Mood and Affect: Mood normal.        Behavior: Behavior normal.     BP 116/78   Pulse 94   Temp 98.3 F (36.8 C)   Resp 16   Ht 5\' 2"  (1.575 m)   Wt 146 lb 12.8 oz (66.6 kg)   SpO2 99%   BMI 26.85 kg/m  Wt Readings from Last 3 Encounters:  04/25/20 146 lb 12.8 oz (66.6 kg)  08/01/19 148 lb 6.4 oz (67.3 kg)  06/06/19 155 lb (70.3 kg)     Lab Results  Component Value Date   WBC 5.9 07/12/2019   HGB 14.2 07/12/2019   HCT 41.5 07/12/2019   PLT 283 07/12/2019   GLUCOSE 98 07/12/2019   CHOL 208 (H) 07/12/2019   TRIG 113 07/12/2019   HDL 46 07/12/2019   LDLCALC 142 (H) 07/12/2019   ALT 22 07/12/2019   AST 19 07/12/2019   NA 140 07/12/2019   K 4.3 07/12/2019   CL 103 07/12/2019   CREATININE 0.67 07/12/2019   BUN 14 07/12/2019   CO2 26 11/07/2015   TSH 1.890 07/12/2019    MM 3D SCREEN BREAST BILATERAL  Result Date: 10/10/2019 CLINICAL DATA:  Screening. EXAM: DIGITAL SCREENING BILATERAL MAMMOGRAM WITH TOMO AND CAD COMPARISON:  Previous exam(s). ACR Breast Density Category b: There are scattered areas of fibroglandular density. FINDINGS: There are no findings suspicious for malignancy. Images were processed with CAD. IMPRESSION: No mammographic evidence of malignancy. A result letter  of this screening mammogram will be mailed directly to the patient. RECOMMENDATION: Screening mammogram in one year. (Code:SM-B-01Y) BI-RADS CATEGORY  1: Negative. Electronically Signed   By: 12/08/2019 M.D.   On: 10/10/2019 15:57       Assessment & Plan:   Problem List Items Addressed This Visit  Essential hypertension    Blood pressure rechecked by me:  124/78.  Doing well.  Continue amlodipine.        Stress    Handling stress well.  Follow.        Thyroid fullness    Saw Dr Tedd Sias.  Had f/u ultrasound.  Stable.  Note reviewed.  No further w/up warranted.            Dale Farwell, MD

## 2020-04-25 NOTE — Assessment & Plan Note (Signed)
Handling stress well.  Follow.  

## 2020-05-17 ENCOUNTER — Other Ambulatory Visit: Payer: 59

## 2020-05-17 DIAGNOSIS — Z1152 Encounter for screening for COVID-19: Secondary | ICD-10-CM

## 2020-05-17 NOTE — Progress Notes (Signed)
Pt presented today with recently being exposed to covid and needs testing for work. CL,RMA

## 2020-05-18 LAB — NOVEL CORONAVIRUS, NAA: SARS-CoV-2, NAA: NOT DETECTED

## 2020-05-18 LAB — SARS-COV-2, NAA 2 DAY TAT

## 2020-06-24 DIAGNOSIS — Z1152 Encounter for screening for COVID-19: Secondary | ICD-10-CM | POA: Diagnosis not present

## 2020-06-24 DIAGNOSIS — Z03818 Encounter for observation for suspected exposure to other biological agents ruled out: Secondary | ICD-10-CM | POA: Diagnosis not present

## 2020-06-25 ENCOUNTER — Other Ambulatory Visit: Payer: 59

## 2020-07-22 ENCOUNTER — Encounter: Payer: Self-pay | Admitting: Internal Medicine

## 2020-08-01 ENCOUNTER — Other Ambulatory Visit: Payer: Self-pay | Admitting: Internal Medicine

## 2020-08-05 ENCOUNTER — Encounter: Payer: 59 | Admitting: Internal Medicine

## 2020-08-14 ENCOUNTER — Ambulatory Visit: Payer: Self-pay

## 2020-08-14 DIAGNOSIS — Z23 Encounter for immunization: Secondary | ICD-10-CM

## 2020-09-02 ENCOUNTER — Other Ambulatory Visit (HOSPITAL_COMMUNITY)
Admission: RE | Admit: 2020-09-02 | Discharge: 2020-09-02 | Disposition: A | Discharge status: Home / Self Care | Payer: 59 | Source: Ambulatory Visit | Attending: Internal Medicine | Admitting: Internal Medicine

## 2020-09-02 ENCOUNTER — Ambulatory Visit (INDEPENDENT_AMBULATORY_CARE_PROVIDER_SITE_OTHER): Payer: 59 | Admitting: Internal Medicine

## 2020-09-02 ENCOUNTER — Other Ambulatory Visit: Payer: Self-pay

## 2020-09-02 VITALS — BP 128/82 | HR 90 | Temp 98.4°F | Resp 16 | Ht 62.0 in | Wt 152.0 lb

## 2020-09-02 DIAGNOSIS — R69 Illness, unspecified: Secondary | ICD-10-CM | POA: Diagnosis not present

## 2020-09-02 DIAGNOSIS — I10 Essential (primary) hypertension: Secondary | ICD-10-CM

## 2020-09-02 DIAGNOSIS — Z124 Encounter for screening for malignant neoplasm of cervix: Secondary | ICD-10-CM

## 2020-09-02 DIAGNOSIS — F439 Reaction to severe stress, unspecified: Secondary | ICD-10-CM

## 2020-09-02 DIAGNOSIS — Z0000 Encounter for general adult medical examination without abnormal findings: Secondary | ICD-10-CM | POA: Diagnosis not present

## 2020-09-02 NOTE — Assessment & Plan Note (Signed)
Physical today 09/02/20.  PAP 09/02/20.  mammogram 1.5.21 - briads I.

## 2020-09-02 NOTE — Progress Notes (Signed)
Patient ID: Stephanie Graves, female   DOB: 1976-09-30, 44 y.o.   MRN: [id number]   Subjective:    Patient ID: Stephanie Graves, female    DOB: [birth year], 44 y.o.   MRN: [id number]  HPI This visit occurred during the SARS-CoV-2 public health emergency.  Safety protocols were in place, including screening questions prior to the visit, additional usage of staff PPE, and extensive cleaning of exam room while observing appropriate contact time as indicated for disinfecting solutions.  Patient here for for her physical exam.  States she is doing well.  Feels good.  Tries to stay active.  Not exercising as much.  No chest pain or sob reported.  No abdominal pain or bowel change reported.  Not watching her diet as well.  Plans to start back exercising and watching her diet.  Handling stress.    Past Medical History:  Diagnosis Date  . Chicken pox as a child   Past Surgical History:  Procedure Laterality Date  . FOOT SURGERY  1995   bone removed from left foot   Family History  Problem Relation Age of Onset  . Hypertension Mother   . Hypercholesterolemia Mother   . Stroke Maternal Grandfather   . Brain cancer Paternal Grandfather   . Breast cancer Neg Hx   . Colon cancer Neg Hx    Social History   Socioeconomic History  . Marital status: Single    Spouse name: Not on file  . Number of children: 0  . Years of education: Not on file  . Highest education level: Not on file  Occupational History  . Not on file  Tobacco Use  . Smoking status: Never Smoker  . Smokeless tobacco: Never Used  Substance and Sexual Activity  . Alcohol use: Yes    Alcohol/week: 0.0 standard drinks    Comment: occasional  . Drug use: No  . Sexual activity: Not on file  Other Topics Concern  . Not on file  Social History Narrative  . Not on file   Social Determinants of Health   Financial Resource Strain:   . Difficulty of Paying Living Expenses: Not on file  Food Insecurity:   . Worried About Patent examiner in the Last Year: Not on file  . Ran Out of Food in the Last Year: Not on file  Transportation Needs:   . Lack of Transportation (Medical): Not on file  . Lack of Transportation (Non-Medical): Not on file  Physical Activity:   . Days of Exercise per Week: Not on file  . Minutes of Exercise per Session: Not on file  Stress:   . Feeling of Stress : Not on file  Social Connections:   . Frequency of Communication with Friends and Family: Not on file  . Frequency of Social Gatherings with Friends and Family: Not on file  . Attends Religious Services: Not on file  . Active Member of Clubs or Organizations: Not on file  . Attends Banker Meetings: Not on file  . Marital Status: Not on file    Outpatient Encounter Medications as of 09/02/2020  Medication Sig  . amLODipine (NORVASC) 2.5 MG tablet TAKE 1 TABLET BY MOUTH EVERY DAY  . ibuprofen (ADVIL,MOTRIN) 200 MG tablet Take 200 mg by mouth every 6 (six) hours as needed.   No facility-administered encounter medications on file as of 09/02/2020.    Review of Systems  Constitutional: Negative for appetite change and unexpected weight change.  HENT: Negative for  congestion, sinus pressure and sore throat.   Eyes: Negative for pain and visual disturbance.  Respiratory: Negative for cough, chest tightness and shortness of breath.   Cardiovascular: Negative for chest pain, palpitations and leg swelling.  Gastrointestinal: Negative for abdominal pain, diarrhea, nausea and vomiting.  Genitourinary: Negative for difficulty urinating and dysuria.  Musculoskeletal: Negative for joint swelling and myalgias.  Skin: Negative for color change and rash.  Neurological: Negative for dizziness, light-headedness and headaches.  Hematological: Negative for adenopathy. Does not bruise/bleed easily.  Psychiatric/Behavioral: Negative for agitation and dysphoric mood.       Objective:    Physical Exam Vitals reviewed.   Constitutional:      General: She is not in acute distress.    Appearance: Normal appearance. She is well-developed.  HENT:     Head: Normocephalic and atraumatic.     Right Ear: External ear normal.     Left Ear: External ear normal.  Eyes:     General: No scleral icterus.       Right eye: No discharge.        Left eye: No discharge.     Conjunctiva/sclera: Conjunctivae normal.  Neck:     Thyroid: No thyromegaly.  Cardiovascular:     Rate and Rhythm: Normal rate and regular rhythm.  Pulmonary:     Effort: No tachypnea, accessory muscle usage or respiratory distress.     Breath sounds: Normal breath sounds. No decreased breath sounds or wheezing.  Chest:     Breasts:        Right: No inverted nipple, mass, nipple discharge or tenderness (no axillary adenopathy).        Left: No inverted nipple, mass, nipple discharge or tenderness (no axilarry adenopathy).  Abdominal:     General: Bowel sounds are normal.     Palpations: Abdomen is soft.     Tenderness: There is no abdominal tenderness.  Genitourinary:    Comments: Normal external genitalia.  Vaginal vault without lesions.  Cervix identified.  Pap smear performed.  Could not appreciate any adnexal masses or tenderness.   Musculoskeletal:        General: No swelling or tenderness.     Cervical back: Neck supple. No tenderness.  Lymphadenopathy:     Cervical: No cervical adenopathy.  Skin:    Findings: No erythema or rash.  Neurological:     Mental Status: She is alert and oriented to person, place, and time.  Psychiatric:        Mood and Affect: Mood normal.        Behavior: Behavior normal.     BP 128/82   Pulse 90   Temp 98.4 F (36.9 C) (Oral)   Resp 16   Ht 5\' 2"  (1.575 m)   Wt 152 lb (68.9 kg)   SpO2 99%   BMI 27.80 kg/m  Wt Readings from Last 3 Encounters:  09/02/20 152 lb (68.9 kg)  04/25/20 146 lb 12.8 oz (66.6 kg)  08/01/19 148 lb 6.4 oz (67.3 kg)     Lab Results  Component Value Date   WBC 5.9  07/12/2019   HGB 14.2 07/12/2019   HCT 41.5 07/12/2019   PLT 283 07/12/2019   GLUCOSE 98 07/12/2019   CHOL 208 (H) 07/12/2019   TRIG 113 07/12/2019   HDL 46 07/12/2019   LDLCALC 142 (H) 07/12/2019   ALT 22 07/12/2019   AST 19 07/12/2019   NA 140 07/12/2019   K 4.3 07/12/2019   CL 103  07/12/2019   CREATININE 0.67 07/12/2019   BUN 14 07/12/2019   CO2 26 11/07/2015   TSH 1.890 07/12/2019    MM 3D SCREEN BREAST BILATERAL  Result Date: 10/10/2019 CLINICAL DATA:  Screening. EXAM: DIGITAL SCREENING BILATERAL MAMMOGRAM WITH TOMO AND CAD COMPARISON:  Previous exam(s). ACR Breast Density Category b: There are scattered areas of fibroglandular density. FINDINGS: There are no findings suspicious for malignancy. Images were processed with CAD. IMPRESSION: No mammographic evidence of malignancy. A result letter of this screening mammogram will be mailed directly to the patient. RECOMMENDATION: Screening mammogram in one year. (Code:SM-B-01Y) BI-RADS CATEGORY  1: Negative. Electronically Signed   By: Norva Pavlov M.D.   On: 10/10/2019 15:57       Assessment & Plan:   Problem List Items Addressed This Visit    Stress    Overall doing well.  Handling stress.  Follow.       Health care maintenance    Physical today 09/02/20.  PAP 09/02/20.  mammogram 1.5.21 - briads I.        Essential hypertension    Continue amlodipine.  Blood pressure ok.  Follow pressures.  Follow metabolic panel.        Other Visit Diagnoses    Cervical cancer screening    -  Primary   Relevant Orders   Cytology - PAP( [facility]) (Completed)       Dale Bray, MD

## 2020-09-05 LAB — CYTOLOGY - PAP
Comment: NEGATIVE
Diagnosis: NEGATIVE
High risk HPV: NEGATIVE

## 2020-09-09 ENCOUNTER — Encounter: Payer: Self-pay | Admitting: Internal Medicine

## 2020-09-09 NOTE — Assessment & Plan Note (Signed)
Overall doing well.  Handling stress.  Follow.   

## 2020-09-09 NOTE — Assessment & Plan Note (Signed)
Continue amlodipine.  Blood pressure ok.  Follow pressures.  Follow metabolic panel.

## 2020-09-19 ENCOUNTER — Other Ambulatory Visit: Payer: Self-pay

## 2020-09-19 DIAGNOSIS — Z0000 Encounter for general adult medical examination without abnormal findings: Secondary | ICD-10-CM

## 2020-09-19 NOTE — Progress Notes (Signed)
Presents with orders for labs from  Dr. Dale Hanska of [facility]. States she already had her physical.  AMD

## 2020-09-20 LAB — CMP12+LP+TP+TSH+6AC+CBC/D/PLT
ALT: 13 IU/L (ref 0–32)
AST: 11 IU/L (ref 0–40)
Albumin/Globulin Ratio: 1.9 (ref 1.2–2.2)
Albumin: 4.1 g/dL (ref 3.8–4.8)
Alkaline Phosphatase: 102 IU/L (ref 44–121)
BUN/Creatinine Ratio: 17 (ref 9–23)
BUN: 13 mg/dL (ref 6–24)
Basophils Absolute: 0.1 10*3/uL (ref 0.0–0.2)
Basos: 1 %
Bilirubin Total: 0.3 mg/dL (ref 0.0–1.2)
Calcium: 9 mg/dL (ref 8.7–10.2)
Chloride: 104 mmol/L (ref 96–106)
Chol/HDL Ratio: 4.2 ratio (ref 0.0–4.4)
Cholesterol, Total: 191 mg/dL (ref 100–199)
Creatinine, Ser: 0.75 mg/dL (ref 0.57–1.00)
EOS (ABSOLUTE): 0.2 10*3/uL (ref 0.0–0.4)
Eos: 3 %
Estimated CHD Risk: 0.9 times avg. (ref 0.0–1.0)
Free Thyroxine Index: 1.4 (ref 1.2–4.9)
GFR calc Af Amer: 113 mL/min/{1.73_m2} (ref >59)
GFR calc non Af Amer: 98 mL/min/{1.73_m2} (ref >59)
GGT: 23 IU/L (ref 0–60)
Globulin, Total: 2.2 g/dL (ref 1.5–4.5)
Glucose: 95 mg/dL (ref 65–99)
HDL: 46 mg/dL (ref >39)
Hematocrit: 40.1 % (ref 34.0–46.6)
Hemoglobin: 13.7 g/dL (ref 11.1–15.9)
Immature Grans (Abs): 0 10*3/uL (ref 0.0–0.1)
Immature Granulocytes: 0 %
Iron: 112 ug/dL (ref 27–159)
LDH: 139 IU/L (ref 119–226)
LDL Chol Calc (NIH): 129 mg/dL — ABNORMAL HIGH (ref 0–99)
Lymphocytes Absolute: 2.5 10*3/uL (ref 0.7–3.1)
Lymphs: 37 %
MCH: 29.8 pg (ref 26.6–33.0)
MCHC: 34.2 g/dL (ref 31.5–35.7)
MCV: 87 fL (ref 79–97)
Monocytes Absolute: 0.5 10*3/uL (ref 0.1–0.9)
Monocytes: 8 %
Neutrophils Absolute: 3.5 10*3/uL (ref 1.4–7.0)
Neutrophils: 51 %
Phosphorus: 3.1 mg/dL (ref 3.0–4.3)
Platelets: 309 10*3/uL (ref 150–450)
Potassium: 4.5 mmol/L (ref 3.5–5.2)
RBC: 4.59 x10E6/uL (ref 3.77–5.28)
RDW: 12.8 % (ref 11.7–15.4)
Sodium: 139 mmol/L (ref 134–144)
T3 Uptake Ratio: 22 % — ABNORMAL LOW (ref 24–39)
T4, Total: 6.3 ug/dL (ref 4.5–12.0)
TSH: 2.81 u[IU]/mL (ref 0.450–4.500)
Total Protein: 6.3 g/dL (ref 6.0–8.5)
Triglycerides: 85 mg/dL (ref 0–149)
Uric Acid: 4.9 mg/dL (ref 2.6–6.2)
VLDL Cholesterol Cal: 16 mg/dL (ref 5–40)
WBC: 6.7 10*3/uL (ref 3.4–10.8)

## 2020-10-10 ENCOUNTER — Other Ambulatory Visit: Payer: Self-pay | Admitting: Internal Medicine

## 2020-10-10 DIAGNOSIS — Z1231 Encounter for screening mammogram for malignant neoplasm of breast: Secondary | ICD-10-CM

## 2020-10-11 ENCOUNTER — Other Ambulatory Visit: Payer: Self-pay

## 2020-10-11 ENCOUNTER — Ambulatory Visit
Admission: RE | Admit: 2020-10-11 | Discharge: 2020-10-11 | Disposition: A | Discharge status: Home / Self Care | Payer: 59 | Source: Ambulatory Visit | Attending: Internal Medicine | Admitting: Internal Medicine

## 2020-10-11 DIAGNOSIS — Z1231 Encounter for screening mammogram for malignant neoplasm of breast: Secondary | ICD-10-CM | POA: Diagnosis not present

## 2020-12-31 ENCOUNTER — Ambulatory Visit: Payer: 59 | Admitting: Internal Medicine

## 2020-12-31 DIAGNOSIS — Z0289 Encounter for other administrative examinations: Secondary | ICD-10-CM

## 2021-02-09 ENCOUNTER — Other Ambulatory Visit: Payer: Self-pay | Admitting: Internal Medicine

## 2021-08-18 ENCOUNTER — Other Ambulatory Visit: Payer: Self-pay | Admitting: Internal Medicine

## 2021-10-27 ENCOUNTER — Other Ambulatory Visit: Payer: Self-pay | Admitting: Internal Medicine

## 2021-10-27 DIAGNOSIS — Z1231 Encounter for screening mammogram for malignant neoplasm of breast: Secondary | ICD-10-CM

## 2021-11-17 ENCOUNTER — Ambulatory Visit (INDEPENDENT_AMBULATORY_CARE_PROVIDER_SITE_OTHER): Payer: 59 | Admitting: Internal Medicine

## 2021-11-17 ENCOUNTER — Encounter: Payer: Self-pay | Admitting: Internal Medicine

## 2021-11-17 ENCOUNTER — Other Ambulatory Visit: Payer: Self-pay

## 2021-11-17 VITALS — BP 130/76 | HR 97 | Temp 97.6°F | Resp 16 | Ht 62.0 in | Wt 150.8 lb

## 2021-11-17 DIAGNOSIS — E0789 Other specified disorders of thyroid: Secondary | ICD-10-CM

## 2021-11-17 DIAGNOSIS — Z0000 Encounter for general adult medical examination without abnormal findings: Secondary | ICD-10-CM | POA: Diagnosis not present

## 2021-11-17 DIAGNOSIS — I10 Essential (primary) hypertension: Secondary | ICD-10-CM | POA: Diagnosis not present

## 2021-11-17 DIAGNOSIS — R69 Illness, unspecified: Secondary | ICD-10-CM | POA: Diagnosis not present

## 2021-11-17 DIAGNOSIS — F439 Reaction to severe stress, unspecified: Secondary | ICD-10-CM

## 2021-11-17 MED ORDER — AMLODIPINE BESYLATE 2.5 MG PO TABS: 2.5000 mg | ORAL_TABLET | Freq: Every day | ORAL | 3 refills | Status: DC

## 2021-11-17 NOTE — Progress Notes (Signed)
Patient ID: Stephanie Graves, female   DOB: [birth year], 46 y.o.   MRN: [id number]   Subjective:    Patient ID: Stephanie Graves, female    DOB: 10/04/1976, 46 y.o.   MRN: [id number]  This visit occurred during the SARS-CoV-2 public health emergency.  Safety protocols were in place, including screening questions prior to the visit, additional usage of staff PPE, and extensive cleaning of exam room while observing appropriate contact time as indicated for disinfecting solutions.   Patient here for her physical exam.   Chief Complaint  Patient presents with   Annual Exam   .   HPI She is doing well.  Increased stress with her job. Overall appears to be handling things well.  No chest pain or sob reported.  No abdominal pain or bowel change reported.  Occasionally will skip a period, but overall periods fairly regular.  Blood pressure doing well.    Past Medical History:  Diagnosis Date   Chicken pox as a child   Past Surgical History:  Procedure Laterality Date   FOOT SURGERY  1995   bone removed from left foot   Family History  Problem Relation Age of Onset   Hypertension Mother    Hypercholesterolemia Mother    Stroke Maternal Grandfather    Brain cancer Paternal Grandfather    Breast cancer Neg Hx    Colon cancer Neg Hx    Social History   Socioeconomic History   Marital status: Single    Spouse name: Not on file   Number of children: 0   Years of education: Not on file   Highest education level: Not on file  Occupational History   Not on file  Tobacco Use   Smoking status: Never   Smokeless tobacco: Never  Substance and Sexual Activity   Alcohol use: Yes    Alcohol/week: 0.0 standard drinks    Comment: occasional   Drug use: No   Sexual activity: Not on file  Other Topics Concern   Not on file  Social History Narrative   Not on file   Social Determinants of Health   Financial Resource Strain: Not on file  Food Insecurity: Not on file  Transportation Needs: Not  on file  Physical Activity: Not on file  Stress: Not on file  Social Connections: Not on file     Review of Systems  Constitutional:  Negative for appetite change and unexpected weight change.  HENT:  Negative for congestion and sinus pressure.   Respiratory:  Negative for cough, chest tightness and shortness of breath.   Cardiovascular:  Negative for chest pain, palpitations and leg swelling.  Gastrointestinal:  Negative for abdominal pain, diarrhea, nausea and vomiting.  Genitourinary:  Negative for difficulty urinating and dysuria.  Musculoskeletal:  Negative for joint swelling and myalgias.  Skin:  Negative for color change and rash.  Neurological:  Negative for dizziness, light-headedness and headaches.  Psychiatric/Behavioral:  Negative for agitation and dysphoric mood.       Objective:     BP 130/76    Pulse 97    Temp 97.6 F (36.4 C)    Resp 16    Ht 5\' 2"  (1.575 m)    Wt 150 lb 12.8 oz (68.4 kg)    SpO2 98%    BMI 27.58 kg/m  Wt Readings from Last 3 Encounters:  11/17/21 150 lb 12.8 oz (68.4 kg)  09/02/20 152 lb (68.9 kg)  04/25/20 146 lb 12.8 oz (66.6 kg)  Physical Exam Vitals reviewed.  Constitutional:      General: She is not in acute distress.    Appearance: Normal appearance. She is well-developed.  HENT:     Head: Normocephalic and atraumatic.     Right Ear: External ear normal.     Left Ear: External ear normal.  Eyes:     General: No scleral icterus.       Right eye: No discharge.        Left eye: No discharge.     Conjunctiva/sclera: Conjunctivae normal.  Neck:     Thyroid: No thyromegaly.  Cardiovascular:     Rate and Rhythm: Normal rate and regular rhythm.  Pulmonary:     Effort: No tachypnea, accessory muscle usage or respiratory distress.     Breath sounds: Normal breath sounds. No decreased breath sounds or wheezing.  Chest:  Breasts:    Right: No inverted nipple, mass, nipple discharge or tenderness (no axillary adenopathy).     Left:  No inverted nipple, mass, nipple discharge or tenderness (no axilarry adenopathy).  Abdominal:     General: Bowel sounds are normal.     Palpations: Abdomen is soft.     Tenderness: There is no abdominal tenderness.  Musculoskeletal:        General: No swelling or tenderness.     Cervical back: Neck supple.  Lymphadenopathy:     Cervical: No cervical adenopathy.  Skin:    Findings: No erythema or rash.  Neurological:     Mental Status: She is alert and oriented to person, place, and time.  Psychiatric:        Mood and Affect: Mood normal.        Behavior: Behavior normal.     Outpatient Encounter Medications as of 11/17/2021  Medication Sig   ibuprofen (ADVIL,MOTRIN) 200 MG tablet Take 200 mg by mouth every 6 (six) hours as needed.   [DISCONTINUED] amLODipine (NORVASC) 2.5 MG tablet TAKE 1 TABLET BY MOUTH EVERY DAY   amLODipine (NORVASC) 2.5 MG tablet Take 1 tablet (2.5 mg total) by mouth daily.   No facility-administered encounter medications on file as of 11/17/2021.     Lab Results  Component Value Date   WBC 6.7 09/19/2020   HGB 13.7 09/19/2020   HCT 40.1 09/19/2020   PLT 309 09/19/2020   GLUCOSE 95 09/19/2020   CHOL 191 09/19/2020   TRIG 85 09/19/2020   HDL 46 09/19/2020   LDLCALC 129 (H) 09/19/2020   ALT 13 09/19/2020   AST 11 09/19/2020   NA 139 09/19/2020   K 4.5 09/19/2020   CL 104 09/19/2020   CREATININE 0.75 09/19/2020   BUN 13 09/19/2020   CO2 26 11/07/2015   TSH 2.810 09/19/2020    MM 3D SCREEN BREAST BILATERAL  Result Date: 10/11/2020 CLINICAL DATA:  Screening. EXAM: DIGITAL SCREENING BILATERAL MAMMOGRAM WITH TOMO AND CAD COMPARISON:  Previous exam(s). ACR Breast Density Category b: There are scattered areas of fibroglandular density. FINDINGS: There are no findings suspicious for malignancy. Images were processed with CAD. IMPRESSION: No mammographic evidence of malignancy. A result letter of this screening mammogram will be mailed directly to the  patient. RECOMMENDATION: Screening mammogram in one year. (Code:SM-B-01Y) BI-RADS CATEGORY  1: Negative. Electronically Signed   By: Emmaline Kluver M.D.   On: 10/11/2020 11:05       Assessment & Plan:   Problem List Items Addressed This Visit     Essential hypertension    Continue amlodipine.  Blood  pressure ok.  Follow pressures.  Follow metabolic panel.       Relevant Medications   amLODipine (NORVASC) 2.5 MG tablet   Health care maintenance    Physical today 11/17/21.  PAP 09/02/20 - negative with negative HPV.  Mammogram scheduled.  Discussed colonoscopy screening - to begin at age 70.        Stress    Increased stress as outlined.  Overall appears to be handling things relatively well.  Follow.       Thyroid fullness    Saw Dr Tedd Sias.  Had f/u ultrasound.  Stable.  Note reviewed.  No further w/up warranted.        Other Visit Diagnoses     Routine general medical examination at a health care facility    -  Primary        Dale Corning, MD

## 2021-11-18 ENCOUNTER — Encounter: Payer: Self-pay | Admitting: Internal Medicine

## 2021-11-18 NOTE — Assessment & Plan Note (Signed)
Increased stress as outlined.  Overall appears to be handling things relatively well.  Follow.  

## 2021-11-18 NOTE — Assessment & Plan Note (Signed)
Saw Dr Solum.  Had f/u ultrasound.  Stable.  Note reviewed.  No further w/up warranted.   

## 2021-11-18 NOTE — Assessment & Plan Note (Signed)
Physical today 11/17/21.  PAP 09/02/20 - negative with negative HPV.  Mammogram scheduled.  Discussed colonoscopy screening - to begin at age 46.

## 2021-11-18 NOTE — Assessment & Plan Note (Signed)
Continue amlodipine.  Blood pressure ok.  Follow pressures.  Follow metabolic panel.  

## 2021-11-28 ENCOUNTER — Other Ambulatory Visit: Payer: Self-pay

## 2021-11-28 ENCOUNTER — Ambulatory Visit
Admission: RE | Admit: 2021-11-28 | Discharge: 2021-11-28 | Disposition: A | Discharge status: Home / Self Care | Payer: 59 | Source: Ambulatory Visit | Attending: Internal Medicine | Admitting: Internal Medicine

## 2021-11-28 DIAGNOSIS — Z1231 Encounter for screening mammogram for malignant neoplasm of breast: Secondary | ICD-10-CM | POA: Diagnosis not present

## 2021-12-02 ENCOUNTER — Other Ambulatory Visit: Payer: Self-pay

## 2021-12-02 NOTE — Progress Notes (Unsigned)
Pt presents today to complete labs for Dr.Charlene scott,MD.

## 2021-12-04 ENCOUNTER — Other Ambulatory Visit: Payer: Self-pay

## 2021-12-04 DIAGNOSIS — Z0000 Encounter for general adult medical examination without abnormal findings: Secondary | ICD-10-CM

## 2021-12-04 DIAGNOSIS — I10 Essential (primary) hypertension: Secondary | ICD-10-CM

## 2021-12-04 NOTE — Progress Notes (Signed)
Pt presents today for outside labs for Dr.scott. ?

## 2021-12-05 LAB — CMP12+LP+TP+TSH+6AC+CBC/D/PLT
ALT: 13 IU/L (ref 0–32)
AST: 14 IU/L (ref 0–40)
Albumin/Globulin Ratio: 1.9 (ref 1.2–2.2)
Albumin: 4.5 g/dL (ref 3.8–4.8)
Alkaline Phosphatase: 100 IU/L (ref 44–121)
BUN/Creatinine Ratio: 20 (ref 9–23)
BUN: 14 mg/dL (ref 6–24)
Basophils Absolute: 0.1 10*3/uL (ref 0.0–0.2)
Basos: 1 %
Bilirubin Total: 0.3 mg/dL (ref 0.0–1.2)
Calcium: 8.9 mg/dL (ref 8.7–10.2)
Chloride: 105 mmol/L (ref 96–106)
Chol/HDL Ratio: 4.4 ratio (ref 0.0–4.4)
Cholesterol, Total: 218 mg/dL — ABNORMAL HIGH (ref 100–199)
Creatinine, Ser: 0.71 mg/dL (ref 0.57–1.00)
EOS (ABSOLUTE): 0.1 10*3/uL (ref 0.0–0.4)
Eos: 2 %
Estimated CHD Risk: 1.1 times avg. — ABNORMAL HIGH (ref 0.0–1.0)
Free Thyroxine Index: 1.8 (ref 1.2–4.9)
GGT: 28 IU/L (ref 0–60)
Globulin, Total: 2.4 g/dL (ref 1.5–4.5)
Glucose: 90 mg/dL (ref 70–99)
HDL: 49 mg/dL (ref >39)
Hematocrit: 40.8 % (ref 34.0–46.6)
Hemoglobin: 14 g/dL (ref 11.1–15.9)
Immature Grans (Abs): 0 10*3/uL (ref 0.0–0.1)
Immature Granulocytes: 0 %
Iron: 136 ug/dL (ref 27–159)
LDH: 149 IU/L (ref 119–226)
LDL Chol Calc (NIH): 153 mg/dL — ABNORMAL HIGH (ref 0–99)
Lymphocytes Absolute: 2.1 10*3/uL (ref 0.7–3.1)
Lymphs: 38 %
MCH: 30.4 pg (ref 26.6–33.0)
MCHC: 34.3 g/dL (ref 31.5–35.7)
MCV: 89 fL (ref 79–97)
Monocytes Absolute: 0.4 10*3/uL (ref 0.1–0.9)
Monocytes: 7 %
Neutrophils Absolute: 2.9 10*3/uL (ref 1.4–7.0)
Neutrophils: 52 %
Phosphorus: 2.8 mg/dL — ABNORMAL LOW (ref 3.0–4.3)
Platelets: 273 10*3/uL (ref 150–450)
Potassium: 4.2 mmol/L (ref 3.5–5.2)
RBC: 4.6 x10E6/uL (ref 3.77–5.28)
RDW: 12.1 % (ref 11.7–15.4)
Sodium: 142 mmol/L (ref 134–144)
T3 Uptake Ratio: 23 % — ABNORMAL LOW (ref 24–39)
T4, Total: 7.8 ug/dL (ref 4.5–12.0)
TSH: 1.89 u[IU]/mL (ref 0.450–4.500)
Total Protein: 6.9 g/dL (ref 6.0–8.5)
Triglycerides: 90 mg/dL (ref 0–149)
Uric Acid: 4.4 mg/dL (ref 2.6–6.2)
VLDL Cholesterol Cal: 16 mg/dL (ref 5–40)
WBC: 5.5 10*3/uL (ref 3.4–10.8)
eGFR: 107 mL/min/{1.73_m2} (ref >59)

## 2021-12-05 LAB — HCV INTERPRETATION

## 2021-12-05 LAB — HIV ANTIBODY (ROUTINE TESTING W REFLEX): HIV Screen 4th Generation wRfx: NONREACTIVE

## 2021-12-05 LAB — HCV AB W REFLEX TO QUANT PCR: HCV Ab: NONREACTIVE

## 2022-05-18 ENCOUNTER — Ambulatory Visit (INDEPENDENT_AMBULATORY_CARE_PROVIDER_SITE_OTHER): Payer: 59 | Admitting: Internal Medicine

## 2022-05-18 ENCOUNTER — Encounter: Payer: Self-pay | Admitting: Internal Medicine

## 2022-05-18 VITALS — BP 124/80 | HR 90 | Temp 98.6°F | Resp 16 | Ht 62.0 in | Wt 144.6 lb

## 2022-05-18 DIAGNOSIS — Z1211 Encounter for screening for malignant neoplasm of colon: Secondary | ICD-10-CM

## 2022-05-18 DIAGNOSIS — E0789 Other specified disorders of thyroid: Secondary | ICD-10-CM | POA: Diagnosis not present

## 2022-05-18 DIAGNOSIS — E785 Hyperlipidemia, unspecified: Secondary | ICD-10-CM | POA: Diagnosis not present

## 2022-05-18 DIAGNOSIS — I10 Essential (primary) hypertension: Secondary | ICD-10-CM

## 2022-05-18 DIAGNOSIS — F439 Reaction to severe stress, unspecified: Secondary | ICD-10-CM | POA: Diagnosis not present

## 2022-05-18 DIAGNOSIS — R69 Illness, unspecified: Secondary | ICD-10-CM | POA: Diagnosis not present

## 2022-05-18 MED ORDER — AMLODIPINE BESYLATE 2.5 MG PO TABS: 2.5000 mg | ORAL_TABLET | Freq: Every day | ORAL | 3 refills | Status: DC

## 2022-05-18 NOTE — Progress Notes (Signed)
Patient ID: Stephanie Graves, female   DOB: Nov 26, 1975, 45 y.o.   MRN: [id number]   Subjective:    Patient ID: Stephanie Graves, female    DOB: Jul 02, 1976, 46 y.o.   MRN: [id number]   Patient here for a scheduled follow up.   Chief Complaint  Patient presents with   Hypertension   .   HPI Reports doing relatively well.  Handling stress.  Stays active.  No chest pain or sob reported.  No abdominal pain.  Bowels moving.  Weight is down.     Past Medical History:  Diagnosis Date   Chicken pox as a child   Past Surgical History:  Procedure Laterality Date   FOOT SURGERY  1995   bone removed from left foot   Family History  Problem Relation Age of Onset   Hypertension Mother    Hypercholesterolemia Mother    Stroke Maternal Grandfather    Brain cancer Paternal Grandfather    Breast cancer Neg Hx    Colon cancer Neg Hx    Social History   Socioeconomic History   Marital status: Single    Spouse name: Not on file   Number of children: 0   Years of education: Not on file   Highest education level: Not on file  Occupational History   Not on file  Tobacco Use   Smoking status: Never   Smokeless tobacco: Never  Substance and Sexual Activity   Alcohol use: Yes    Alcohol/week: 0.0 standard drinks of alcohol    Comment: occasional   Drug use: No   Sexual activity: Not on file  Other Topics Concern   Not on file  Social History Narrative   Not on file   Social Determinants of Health   Financial Resource Strain: Not on file  Food Insecurity: Not on file  Transportation Needs: Not on file  Physical Activity: Not on file  Stress: Not on file  Social Connections: Not on file     Review of Systems  Constitutional:  Negative for appetite change and unexpected weight change.  HENT:  Negative for congestion and sinus pressure.   Respiratory:  Negative for cough, chest tightness and shortness of breath.   Cardiovascular:  Negative for chest pain, palpitations and leg  swelling.  Gastrointestinal:  Negative for abdominal pain, diarrhea, nausea and vomiting.  Genitourinary:  Negative for difficulty urinating and dysuria.  Musculoskeletal:  Negative for joint swelling and myalgias.  Skin:  Negative for color change and rash.  Neurological:  Negative for dizziness, light-headedness and headaches.  Psychiatric/Behavioral:  Negative for agitation and dysphoric mood.        Objective:     BP 124/80 (BP Location: Left Arm, Patient Position: Sitting, Cuff Size: Small)   Pulse 90   Temp 98.6 F (37 C) (Temporal)   Resp 16   Ht 5\' 2"  (1.575 m)   Wt 144 lb 9.6 oz (65.6 kg)   SpO2 94%   BMI 26.45 kg/m  Wt Readings from Last 3 Encounters:  05/18/22 144 lb 9.6 oz (65.6 kg)  11/17/21 150 lb 12.8 oz (68.4 kg)  09/02/20 152 lb (68.9 kg)    Physical Exam Vitals reviewed.  Constitutional:      General: She is not in acute distress.    Appearance: Normal appearance.  HENT:     Head: Normocephalic and atraumatic.     Right Ear: External ear normal.     Left Ear: External ear normal.  Eyes:  General: No scleral icterus.       Right eye: No discharge.        Left eye: No discharge.     Conjunctiva/sclera: Conjunctivae normal.  Neck:     Thyroid: No thyromegaly.  Cardiovascular:     Rate and Rhythm: Normal rate and regular rhythm.  Pulmonary:     Effort: No respiratory distress.     Breath sounds: Normal breath sounds. No wheezing.  Abdominal:     General: Bowel sounds are normal.     Palpations: Abdomen is soft.     Tenderness: There is no abdominal tenderness.  Musculoskeletal:        General: No swelling or tenderness.     Cervical back: Neck supple. No tenderness.  Lymphadenopathy:     Cervical: No cervical adenopathy.  Skin:    Findings: No erythema or rash.  Neurological:     Mental Status: She is alert.  Psychiatric:        Mood and Affect: Mood normal.        Behavior: Behavior normal.      Outpatient Encounter Medications  as of 05/18/2022  Medication Sig   ibuprofen (ADVIL,MOTRIN) 200 MG tablet Take 200 mg by mouth every 6 (six) hours as needed.   [DISCONTINUED] amLODipine (NORVASC) 2.5 MG tablet Take 1 tablet (2.5 mg total) by mouth daily.   amLODipine (NORVASC) 2.5 MG tablet Take 1 tablet (2.5 mg total) by mouth daily.   No facility-administered encounter medications on file as of 05/18/2022.     Lab Results  Component Value Date   WBC 5.5 12/04/2021   HGB 14.0 12/04/2021   HCT 40.8 12/04/2021   PLT 273 12/04/2021   GLUCOSE 90 12/04/2021   CHOL 218 (H) 12/04/2021   TRIG 90 12/04/2021   HDL 49 12/04/2021   LDLCALC 153 (H) 12/04/2021   ALT 13 12/04/2021   AST 14 12/04/2021   NA 142 12/04/2021   K 4.2 12/04/2021   CL 105 12/04/2021   CREATININE 0.71 12/04/2021   BUN 14 12/04/2021   CO2 26 11/07/2015   TSH 1.890 12/04/2021    MM 3D SCREEN BREAST BILATERAL  Result Date: 11/28/2021 CLINICAL DATA:  Screening. EXAM: DIGITAL SCREENING BILATERAL MAMMOGRAM WITH TOMOSYNTHESIS AND CAD TECHNIQUE: Bilateral screening digital craniocaudal and mediolateral oblique mammograms were obtained. Bilateral screening digital breast tomosynthesis was performed. The images were evaluated with computer-aided detection. COMPARISON:  Previous exam(s). ACR Breast Density Category b: There are scattered areas of fibroglandular density. FINDINGS: There are no findings suspicious for malignancy. IMPRESSION: No mammographic evidence of malignancy. A result letter of this screening mammogram will be mailed directly to the patient. RECOMMENDATION: Screening mammogram in one year. (Code:SM-B-01Y) BI-RADS CATEGORY  1: Negative. Electronically Signed   By: Frederico Hamman M.D.   On: 11/28/2021 14:13      Assessment & Plan:   Problem List Items Addressed This Visit     Essential hypertension    Continue amlodipine.  Blood pressure ok.  Recheck by me:  118/74. Follow pressures.  Follow metabolic panel.       Relevant Medications    amLODipine (NORVASC) 2.5 MG tablet   Other Relevant Orders   Basic Metabolic Panel (BMET)   Stress    Increased stress as outlined.  Overall appears to be handling things relatively well.  Follow.       Thyroid fullness    Saw Dr Tedd Sias.  Had f/u ultrasound.  Stable.  Note reviewed.  No further  w/up warranted.        Other Visit Diagnoses     Screening for colon cancer    -  Primary   Relevant Orders   Ambulatory referral to Gastroenterology   Hyperlipidemia, unspecified hyperlipidemia type       Relevant Medications   amLODipine (NORVASC) 2.5 MG tablet   Other Relevant Orders   CBC with Differential/Platelet   Hepatic function panel   Lipid Profile   TSH        Einar Pheasant, MD

## 2022-05-25 ENCOUNTER — Encounter: Payer: Self-pay | Admitting: Internal Medicine

## 2022-05-25 NOTE — Assessment & Plan Note (Signed)
Increased stress as outlined.  Overall appears to be handling things relatively well.  Follow.  

## 2022-05-25 NOTE — Assessment & Plan Note (Signed)
Continue amlodipine.  Blood pressure ok.  Recheck by me:  118/74. Follow pressures.  Follow metabolic panel.

## 2022-05-25 NOTE — Assessment & Plan Note (Signed)
Saw Dr Solum.  Had f/u ultrasound.  Stable.  Note reviewed.  No further w/up warranted.   

## 2022-06-16 ENCOUNTER — Other Ambulatory Visit: Payer: Self-pay

## 2022-06-16 ENCOUNTER — Telehealth: Payer: Self-pay

## 2022-06-16 DIAGNOSIS — Z1211 Encounter for screening for malignant neoplasm of colon: Secondary | ICD-10-CM

## 2022-06-16 MED ORDER — NA SULFATE-K SULFATE-MG SULF 17.5-3.13-1.6 GM/177ML PO SOLN: 1.0000 | Freq: Once | ORAL | 0 refills | Status: AC

## 2022-06-16 NOTE — Telephone Encounter (Signed)
Gastroenterology Pre-Procedure Review  Request Date: 07/20/22 Requesting Physician: Dr. Tobi Bastos  PATIENT REVIEW QUESTIONS: The patient responded to the following health history questions as indicated:    1. Are you having any GI issues? no 2. Do you have a personal history of Polyps? no 3. Do you have a family history of Colon Cancer or Polyps? no 4. Diabetes Mellitus? no 5. Joint replacements in the past 12 months?no 6. Major health problems in the past 3 months?no 7. Any artificial heart valves, MVP, or defibrillator?no    MEDICATIONS & ALLERGIES:    Patient reports the following regarding taking any anticoagulation/antiplatelet therapy:   Plavix, Coumadin, Eliquis, Xarelto, Lovenox, Pradaxa, Brilinta, or Effient? no Aspirin? no  Patient confirms/reports the following medications:  Current Outpatient Medications  Medication Sig Dispense Refill   amLODipine (NORVASC) 2.5 MG tablet Take 1 tablet (2.5 mg total) by mouth daily. 90 tablet 3   ibuprofen (ADVIL,MOTRIN) 200 MG tablet Take 200 mg by mouth every 6 (six) hours as needed.     No current facility-administered medications for this visit.    Patient confirms/reports the following allergies:  No Known Allergies  No orders of the defined types were placed in this encounter.   AUTHORIZATION INFORMATION Primary Insurance: 1D#: Group #:  Secondary Insurance: 1D#: Group #:  SCHEDULE INFORMATION: Date: 07/20/22 Time: Location: [facility]

## 2022-07-17 ENCOUNTER — Encounter: Payer: Self-pay | Admitting: Gastroenterology

## 2022-07-20 ENCOUNTER — Ambulatory Visit: Payer: 59 | Admitting: Anesthesiology

## 2022-07-20 ENCOUNTER — Encounter
Admission: RE | Disposition: A | Discharge status: Home / Self Care | Payer: Self-pay | Source: Home / Self Care | Attending: Gastroenterology

## 2022-07-20 ENCOUNTER — Ambulatory Visit
Admission: RE | Admit: 2022-07-20 | Discharge: 2022-07-20 | Disposition: A | Discharge status: Home / Self Care | Payer: 59 | Attending: Gastroenterology | Admitting: Gastroenterology

## 2022-07-20 ENCOUNTER — Encounter: Payer: Self-pay | Admitting: Gastroenterology

## 2022-07-20 DIAGNOSIS — Z1211 Encounter for screening for malignant neoplasm of colon: Secondary | ICD-10-CM

## 2022-07-20 DIAGNOSIS — Z79899 Other long term (current) drug therapy: Secondary | ICD-10-CM | POA: Insufficient documentation

## 2022-07-20 DIAGNOSIS — I10 Essential (primary) hypertension: Secondary | ICD-10-CM | POA: Diagnosis not present

## 2022-07-20 HISTORY — PX: COLONOSCOPY WITH PROPOFOL: SHX5780

## 2022-07-20 HISTORY — DX: Essential (primary) hypertension: I10

## 2022-07-20 LAB — POCT PREGNANCY, URINE: Preg Test, Ur: NEGATIVE

## 2022-07-20 SURGERY — COLONOSCOPY WITH PROPOFOL
Anesthesia: General

## 2022-07-20 MED ORDER — LIDOCAINE 2% (20 MG/ML) 5 ML SYRINGE
INTRAMUSCULAR | Status: DC | PRN
  Administered 2022-07-20: 50 mg via INTRAVENOUS

## 2022-07-20 MED ORDER — PROPOFOL 500 MG/50ML IV EMUL
INTRAVENOUS | Status: DC | PRN
  Administered 2022-07-20: 140 ug/kg/min via INTRAVENOUS

## 2022-07-20 MED ORDER — PROPOFOL 10 MG/ML IV BOLUS
INTRAVENOUS | Status: DC | PRN
  Administered 2022-07-20: 60 mg via INTRAVENOUS
  Administered 2022-07-20 (×2): 20 mg via INTRAVENOUS

## 2022-07-20 MED ORDER — STERILE WATER FOR IRRIGATION IR SOLN
Status: DC | PRN
  Administered 2022-07-20: 60 mL

## 2022-07-20 MED ORDER — SODIUM CHLORIDE 0.9 % IV SOLN: INTRAVENOUS | Status: DC

## 2022-07-20 NOTE — H&P (Signed)
     Jonathon Bellows, MD [address] [place], [place], [place], [address] [address], [place], [place], [place], [address] Phone: [phone]  Fax: [phone]  Primary Care Physician:  Einar Pheasant, MD   Pre-Procedure History & Physical: HPI:  Stephanie Graves is a 46 y.o. female is here for an colonoscopy.   Past Medical History:  Diagnosis Date   Chicken pox as a child   Hypertension     Past Surgical History:  Procedure Laterality Date   FOOT SURGERY  1995   bone removed from left foot    Prior to Admission medications   Medication Sig Start Date End Date Taking? Authorizing Provider  amLODipine (NORVASC) 2.5 MG tablet Take 1 tablet (2.5 mg total) by mouth daily. 05/18/22  Yes Einar Pheasant, MD  ibuprofen (ADVIL,MOTRIN) 200 MG tablet Take 200 mg by mouth every 6 (six) hours as needed.    [provider]    Allergies as of 06/16/2022   (No Known Allergies)    Family History  Problem Relation Age of Onset   Hypertension Mother    Hypercholesterolemia Mother    Stroke Maternal Grandfather    Brain cancer Paternal Grandfather    Breast cancer Neg Hx    Colon cancer Neg Hx     Social History   Socioeconomic History   Marital status: Single    Spouse name: Not on file   Number of children: 0   Years of education: Not on file   Highest education level: Not on file  Occupational History   Not on file  Tobacco Use   Smoking status: Never   Smokeless tobacco: Never  Vaping Use   Vaping Use: Never used  Substance and Sexual Activity   Alcohol use: Yes    Alcohol/week: 0.0 standard drinks of alcohol    Comment: occasional   Drug use: No   Sexual activity: Not on file  Other Topics Concern   Not on file  Social History Narrative   Not on file   Social Determinants of Health   Financial Resource Strain: Not on file  Food Insecurity: Not on file  Transportation Needs: Not on file  Physical Activity: Not on file  Stress: Not on file   Social Connections: Not on file  Intimate Partner Violence: Not on file    Review of Systems: See HPI, otherwise negative ROS  Physical Exam: BP (!) 120/96   Pulse 82   Temp (!) 96.4 F (35.8 C) (Temporal)   Resp 16   Ht 5\' 2"  (1.575 m)   Wt 63 kg   SpO2 100%   BMI 25.42 kg/m  General:   Alert,  pleasant and cooperative in NAD Head:  Normocephalic and atraumatic. Neck:  Supple; no masses or thyromegaly. Lungs:  Clear throughout to auscultation, normal respiratory effort.    Heart:  +S1, +S2, Regular rate and rhythm, No edema. Abdomen:  Soft, nontender and nondistended. Normal bowel sounds, without guarding, and without rebound.   Neurologic:  Alert and  oriented x4;  grossly normal neurologically.  Impression/Plan: Stephanie Graves is here for an colonoscopy to be performed for Screening colonoscopy average risk   Risks, benefits, limitations, and alternatives regarding  colonoscopy have been reviewed with the patient.  Questions have been answered.  All parties agreeable.   Jonathon Bellows, MD  07/20/2022, 10:33 AM

## 2022-07-20 NOTE — Anesthesia Postprocedure Evaluation (Signed)
Anesthesia Post Note  Patient: Stephanie Graves  Procedure(s) Performed: COLONOSCOPY WITH PROPOFOL  Patient location during evaluation: PACU Anesthesia Type: General Level of consciousness: awake and oriented Pain management: satisfactory to patient Vital Signs Assessment: post-procedure vital signs reviewed and stable Respiratory status: nonlabored ventilation and respiratory function stable Cardiovascular status: stable Anesthetic complications: no   No notable events documented.   Last Vitals:  Vitals:   07/20/22 1113 07/20/22 1122  BP: 134/77 (!) 140/77  Pulse: 76 76  Resp: 13 13  Temp:    SpO2: 100% 100%    Last Pain:  Vitals:   07/20/22 1122  TempSrc:   PainSc: 0-No pain                 VAN STAVEREN,Osby Sweetin

## 2022-07-20 NOTE — Transfer of Care (Signed)
Immediate Anesthesia Transfer of Care Note  Patient: Stephanie Graves  Procedure(s) Performed: COLONOSCOPY WITH PROPOFOL  Patient Location: Endoscopy Unit  Anesthesia Type:General  Level of Consciousness: drowsy  Airway & Oxygen Therapy: Patient Spontanous Breathing  Post-op Assessment: Report given to RN and Post -op Vital signs reviewed and stable  Post vital signs: Reviewed and stable  Last Vitals:  Vitals Value Taken Time  BP    Temp    Pulse    Resp    SpO2      Last Pain:  Vitals:   07/20/22 0854  TempSrc: Temporal         Complications: No notable events documented.

## 2022-07-20 NOTE — Op Note (Signed)
[facility] Gastroenterology Patient Name: Stephanie Graves Procedure Date: 07/20/2022 10:31 AM MRN: [id number] Account #: [id number] Date of Birth: 01-22-1976 Admit Type: Outpatient Age: 46 Room: [facility] ENDO ROOM 1 Gender: Female Note Status: Finalized Instrument Name: Jasper Riling [id number] Procedure:             Colonoscopy Indications:           Screening for colorectal malignant neoplasm Providers:             Jonathon Bellows MD, MD Medicines:             Monitored Anesthesia Care Complications:         No immediate complications. Procedure:             Pre-Anesthesia Assessment:                        - Prior to the procedure, a History and Physical was                         performed, and patient medications, allergies and                         sensitivities were reviewed. The patient's tolerance                         of previous anesthesia was reviewed.                        - The risks and benefits of the procedure and the                         sedation options and risks were discussed with the                         patient. All questions were answered and informed                         consent was obtained.                        - ASA Grade Assessment: II - A patient with mild                         systemic disease.                        After obtaining informed consent, the colonoscope was                         passed under direct vision. Throughout the procedure,                         the patient's blood pressure, pulse, and oxygen                         saturations were monitored continuously. The                         Colonoscope was introduced through the anus and  advanced to the the cecum, identified by the                         appendiceal orifice. The colonoscopy was performed                         with ease. The patient tolerated the procedure well.                         The quality of the bowel  preparation was excellent. Findings:      The perianal and digital rectal examinations were normal.      The entire examined colon appeared normal on direct and retroflexion       views. Impression:            - The entire examined colon is normal on direct and                         retroflexion views.                        - No specimens collected. Recommendation:        - Discharge patient to home (with escort).                        - Resume previous diet.                        - Continue present medications.                        - Repeat colonoscopy in 10 years for screening                         purposes. Procedure Code(s):     --- Professional ---                        [id number], Colonoscopy, flexible; diagnostic, including                         collection of specimen(s) by brushing or washing, when                         performed (separate procedure) Diagnosis Code(s):     --- Professional ---                        Z12.11, Encounter for screening for malignant neoplasm                         of colon CPT copyright 2019 [employer] Medical [employer]. All rights reserved. The codes documented in this report are preliminary and upon coder review may  be revised to meet current compliance requirements. Jonathon Bellows, MD Jonathon Bellows MD, MD 07/20/2022 11:03:26 AM This report has been signed electronically. Number of Addenda: 0 Note Initiated On: 07/20/2022 10:31 AM Scope Withdrawal Time: 0 hours 11 minutes 58 seconds  Total Procedure Duration: 0 hours 16 minutes 4 seconds  Estimated Blood Loss:  Estimated blood loss: none.      [facility]

## 2022-07-20 NOTE — Anesthesia Preprocedure Evaluation (Signed)
Anesthesia Evaluation  Patient identified by MRN, date of birth, ID band Patient awake    Reviewed: Allergy & Precautions, NPO status , Patient's Chart, lab work & pertinent test results  Airway Mallampati: II  TM Distance: >3 FB Neck ROM: full    Dental  (+) Teeth Intact, Caps,    Pulmonary neg pulmonary ROS,    Pulmonary exam normal breath sounds clear to auscultation       Cardiovascular Exercise Tolerance: Good hypertension, negative cardio ROS Normal cardiovascular exam Rhythm:Regular     Neuro/Psych negative neurological ROS  negative psych ROS   GI/Hepatic negative GI ROS, Neg liver ROS,   Endo/Other  negative endocrine ROS  Renal/GU negative Renal ROS  negative genitourinary   Musculoskeletal negative musculoskeletal ROS (+)   Abdominal Normal abdominal exam  (+)   Peds negative pediatric ROS (+)  Hematology negative hematology ROS (+)   Anesthesia Other Findings Past Medical History: as a child: Chicken pox No date: Hypertension  Past Surgical History: 1995: FOOT SURGERY     Comment:  bone removed from left foot  BMI    Body Mass Index: 25.42 kg/m      Reproductive/Obstetrics negative OB ROS                             Anesthesia Physical Anesthesia Plan  ASA: 2  Anesthesia Plan: General   Post-op Pain Management:    Induction: Intravenous  PONV Risk Score and Plan: Propofol infusion and TIVA  Airway Management Planned: Natural Airway  Additional Equipment:   Intra-op Plan:   Post-operative Plan:   Informed Consent: I have reviewed the patients History and Physical, chart, labs and discussed the procedure including the risks, benefits and alternatives for the proposed anesthesia with the patient or authorized representative who has indicated his/her understanding and acceptance.     Dental Advisory Given  Plan Discussed with: CRNA and  Surgeon  Anesthesia Plan Comments:         Anesthesia Quick Evaluation

## 2022-07-21 ENCOUNTER — Encounter: Payer: Self-pay | Admitting: Gastroenterology

## 2022-10-06 ENCOUNTER — Encounter: Payer: Self-pay | Admitting: Internal Medicine

## 2022-10-06 NOTE — Telephone Encounter (Signed)
Please confirm if the 9:00 patient is coming in.  I know is short notice, but can see if Gift can come in at 9:00 Wednesday 10/07/22.

## 2022-10-07 ENCOUNTER — Encounter: Payer: Self-pay | Admitting: Internal Medicine

## 2022-10-07 ENCOUNTER — Ambulatory Visit (INDEPENDENT_AMBULATORY_CARE_PROVIDER_SITE_OTHER): Payer: 59 | Admitting: Internal Medicine

## 2022-10-07 VITALS — BP 116/74 | HR 91 | Temp 98.6°F | Resp 15 | Ht 62.0 in | Wt 142.4 lb

## 2022-10-07 DIAGNOSIS — I10 Essential (primary) hypertension: Secondary | ICD-10-CM

## 2022-10-07 DIAGNOSIS — F439 Reaction to severe stress, unspecified: Secondary | ICD-10-CM

## 2022-10-07 DIAGNOSIS — Z30011 Encounter for initial prescription of contraceptive pills: Secondary | ICD-10-CM

## 2022-10-07 DIAGNOSIS — E0789 Other specified disorders of thyroid: Secondary | ICD-10-CM

## 2022-10-07 DIAGNOSIS — R69 Illness, unspecified: Secondary | ICD-10-CM | POA: Diagnosis not present

## 2022-10-07 DIAGNOSIS — Z1231 Encounter for screening mammogram for malignant neoplasm of breast: Secondary | ICD-10-CM | POA: Diagnosis not present

## 2022-10-07 MED ORDER — NORETHINDRONE 0.35 MG PO TABS: 1.0000 | ORAL_TABLET | Freq: Every day | ORAL | 11 refills | Status: DC

## 2022-10-07 NOTE — Patient Instructions (Signed)
YOUR MAMMOGRAM IS DUE, PLEASE CALL AND GET THIS SCHEDULED!  A mammogram has been ordered for you. Please call the below facility to schedule ::  [facility] - call [phone]    

## 2022-10-07 NOTE — Progress Notes (Signed)
Subjective:    Patient ID: Stephanie Graves, female    DOB: Feb 25, 1976, 47 y.o.   MRN: [id number]  Patient here for work in appt  HPI Work in appt to discuss restarting birth control.  Reports she is doing well.  Feels good.  Trying to stay active.  Work is going ok.  Stress better currently.  No chest pain or sob reported.  No abdominal pain.  Bowels moving.  Wants to restart ocp's.  In a new relationship.  Wants to be prepared.  Discussed history of elevated blood pressure.  Discussed birth control options.  Desires ocp's.  Discussed estrogen only pill.  Agreeable.     Past Medical History:  Diagnosis Date   Chicken pox as a child   Hypertension    Past Surgical History:  Procedure Laterality Date   COLONOSCOPY WITH PROPOFOL N/A 07/20/2022   Procedure: COLONOSCOPY WITH PROPOFOL;  Surgeon: Jonathon Bellows, MD;  Location: [facility] ENDOSCOPY;  Service: Gastroenterology;  Laterality: N/A;   FOOT SURGERY  1995   bone removed from left foot   Family History  Problem Relation Age of Onset   Hypertension Mother    Hypercholesterolemia Mother    Stroke Maternal Grandfather    Brain cancer Paternal Grandfather    Breast cancer Neg Hx    Colon cancer Neg Hx    Social History   Socioeconomic History   Marital status: Single    Spouse name: Not on file   Number of children: 0   Years of education: Not on file   Highest education level: Not on file  Occupational History   Not on file  Tobacco Use   Smoking status: Never   Smokeless tobacco: Never  Vaping Use   Vaping Use: Never used  Substance and Sexual Activity   Alcohol use: Yes    Alcohol/week: 0.0 standard drinks of alcohol    Comment: occasional   Drug use: No   Sexual activity: Not on file  Other Topics Concern   Not on file  Social History Narrative   Not on file   Social Determinants of Health   Financial Resource Strain: Not on file  Food Insecurity: Not on file  Transportation Needs: Not on file  Physical Activity:  Not on file  Stress: Not on file  Social Connections: Not on file     Review of Systems  Constitutional:  Negative for appetite change and unexpected weight change.  HENT:  Negative for congestion and sinus pressure.   Respiratory:  Negative for cough, chest tightness and shortness of breath.   Cardiovascular:  Negative for chest pain, palpitations and leg swelling.  Gastrointestinal:  Negative for abdominal pain, diarrhea, nausea and vomiting.  Genitourinary:  Negative for difficulty urinating and dysuria.  Musculoskeletal:  Negative for joint swelling and myalgias.  Skin:  Negative for color change and rash.  Neurological:  Negative for dizziness and headaches.  Psychiatric/Behavioral:  Negative for agitation and dysphoric mood.        Objective:     BP 116/74 (BP Location: Left Arm, Patient Position: Sitting, Cuff Size: Small)   Pulse 91   Temp 98.6 F (37 C) (Temporal)   Resp 15   Ht 5\' 2"  (1.575 m)   Wt 142 lb 6.4 oz (64.6 kg)   SpO2 97%   BMI 26.05 kg/m  Wt Readings from Last 3 Encounters:  10/07/22 142 lb 6.4 oz (64.6 kg)  07/20/22 139 lb (63 kg)  05/18/22 144 lb 9.6 oz (  65.6 kg)    Physical Exam Vitals reviewed.  Constitutional:      General: She is not in acute distress.    Appearance: Normal appearance.  HENT:     Head: Normocephalic and atraumatic.     Right Ear: External ear normal.     Left Ear: External ear normal.  Eyes:     General: No scleral icterus.       Right eye: No discharge.        Left eye: No discharge.     Conjunctiva/sclera: Conjunctivae normal.  Neck:     Thyroid: No thyromegaly.  Cardiovascular:     Rate and Rhythm: Normal rate and regular rhythm.  Pulmonary:     Effort: No respiratory distress.     Breath sounds: Normal breath sounds. No wheezing.  Abdominal:     General: Bowel sounds are normal.     Palpations: Abdomen is soft.     Tenderness: There is no abdominal tenderness.  Musculoskeletal:        General: No  swelling or tenderness.     Cervical back: Neck supple. No tenderness.  Lymphadenopathy:     Cervical: No cervical adenopathy.  Skin:    Findings: No erythema or rash.  Neurological:     Mental Status: She is alert.  Psychiatric:        Mood and Affect: Mood normal.        Behavior: Behavior normal.      Outpatient Encounter Medications as of 10/07/2022  Medication Sig   amLODipine (NORVASC) 2.5 MG tablet Take 1 tablet (2.5 mg total) by mouth daily.   ibuprofen (ADVIL,MOTRIN) 200 MG tablet Take 200 mg by mouth every 6 (six) hours as needed.   norethindrone (MICRONOR) 0.35 MG tablet Take 1 tablet (0.35 mg total) by mouth daily.   No facility-administered encounter medications on file as of 10/07/2022.     Lab Results  Component Value Date   WBC 5.5 12/04/2021   HGB 14.0 12/04/2021   HCT 40.8 12/04/2021   PLT 273 12/04/2021   GLUCOSE 90 12/04/2021   CHOL 218 (H) 12/04/2021   TRIG 90 12/04/2021   HDL 49 12/04/2021   LDLCALC 153 (H) 12/04/2021   ALT 13 12/04/2021   AST 14 12/04/2021   NA 142 12/04/2021   K 4.2 12/04/2021   CL 105 12/04/2021   CREATININE 0.71 12/04/2021   BUN 14 12/04/2021   CO2 26 11/07/2015   TSH 1.890 12/04/2021    No results found.     Assessment & Plan:  Encounter for screening mammogram for malignant neoplasm of breast -     3D Screening Mammogram, Left and Right; Future  Essential hypertension Assessment & Plan: On low dose amlodipine.  Blood pressure increased on my check. Discussed risk of ocp's - estrogen.  Discussed progesterone only pill.  Agreeable.  Start micronor.  Follow pressures.  Spot check readings.  Send in for review.     Stress Assessment & Plan: Overall appears to be handling things relatively well.  Follow.    Thyroid fullness Assessment & Plan: Saw Dr Gabriel Carina.  Had f/u ultrasound.  Stable.  Note reviewed.  No further w/up warranted.     OCP (oral contraceptive pills) initiation Assessment & Plan:  Wants to restart  ocp's.  In a new relationship.  Wants to be prepared.  Discussed history of elevated blood pressure.  Discussed birth control options.  Desires ocp's.  Discussed estrogen only pill.  Agreeable.  Other orders -     Norethindrone; Take 1 tablet (0.35 mg total) by mouth daily.  Dispense: 28 tablet; Refill: 11     Dale [place], MD

## 2022-10-11 ENCOUNTER — Encounter: Payer: Self-pay | Admitting: Internal Medicine

## 2022-10-11 DIAGNOSIS — Z30011 Encounter for initial prescription of contraceptive pills: Secondary | ICD-10-CM | POA: Insufficient documentation

## 2022-10-11 NOTE — Assessment & Plan Note (Signed)
Wants to restart ocp's.  In a new relationship.  Wants to be prepared.  Discussed history of elevated blood pressure.  Discussed birth control options.  Desires ocp's.  Discussed estrogen only pill.  Agreeable.

## 2022-10-11 NOTE — Assessment & Plan Note (Signed)
Saw Dr Solum.  Had f/u ultrasound.  Stable.  Note reviewed.  No further w/up warranted.   

## 2022-10-11 NOTE — Assessment & Plan Note (Signed)
On low dose amlodipine.  Blood pressure increased on my check. Discussed risk of ocp's - estrogen.  Discussed progesterone only pill.  Agreeable.  Start micronor.  Follow pressures.  Spot check readings.  Send in for review.

## 2022-10-11 NOTE — Assessment & Plan Note (Signed)
Overall appears to be handling things relatively well.  Follow.   

## 2022-11-19 ENCOUNTER — Encounter: Payer: Self-pay | Admitting: Internal Medicine

## 2022-11-19 ENCOUNTER — Ambulatory Visit (INDEPENDENT_AMBULATORY_CARE_PROVIDER_SITE_OTHER): Payer: 59 | Admitting: Internal Medicine

## 2022-11-19 ENCOUNTER — Other Ambulatory Visit (HOSPITAL_COMMUNITY)
Admission: RE | Admit: 2022-11-19 | Discharge: 2022-11-19 | Disposition: A | Discharge status: Home / Self Care | Payer: 59 | Source: Ambulatory Visit | Attending: Internal Medicine | Admitting: Internal Medicine

## 2022-11-19 VITALS — BP 122/70 | HR 90 | Temp 98.3°F | Resp 16 | Ht 62.0 in | Wt 135.8 lb

## 2022-11-19 DIAGNOSIS — Z124 Encounter for screening for malignant neoplasm of cervix: Secondary | ICD-10-CM | POA: Insufficient documentation

## 2022-11-19 DIAGNOSIS — E0789 Other specified disorders of thyroid: Secondary | ICD-10-CM

## 2022-11-19 DIAGNOSIS — I10 Essential (primary) hypertension: Secondary | ICD-10-CM

## 2022-11-19 DIAGNOSIS — E785 Hyperlipidemia, unspecified: Secondary | ICD-10-CM | POA: Diagnosis not present

## 2022-11-19 DIAGNOSIS — R69 Illness, unspecified: Secondary | ICD-10-CM | POA: Diagnosis not present

## 2022-11-19 DIAGNOSIS — Z0000 Encounter for general adult medical examination without abnormal findings: Secondary | ICD-10-CM

## 2022-11-19 DIAGNOSIS — F439 Reaction to severe stress, unspecified: Secondary | ICD-10-CM

## 2022-11-19 DIAGNOSIS — Z30011 Encounter for initial prescription of contraceptive pills: Secondary | ICD-10-CM

## 2022-11-19 NOTE — Assessment & Plan Note (Addendum)
Physical today 11/19/22.  PAP 09/02/20 - negative with negative HPV.  F/u pap 11/19/22. Mammogram 11/28/21 - Birads I.  F/u mammogram scheduled for 11/30/22. Colonoscopy 07/20/22 - recommended f/u colonoscopy in 10 years.

## 2022-11-19 NOTE — Progress Notes (Signed)
Subjective:    Patient ID: Stephanie Graves, female    DOB: 11-25-1975, 47 y.o.   MRN: [id number]  Patient here for  Chief Complaint  Patient presents with   Annual Exam    HPI Here for her physical exam.  Doing well.  Feels good.  Handling stress.  Tolerating micornor.LMP 2/3-11/11/22. Will keep menstrual diary.  No chest pain or sob reported. No abdominal pain or bowel change reported.  Handling stress.  Overall she feels she is doing well.  On amlodipine.  Blood pressures averaging mostly 114-128/70-80s.     Past Medical History:  Diagnosis Date   Chicken pox as a child   Hypertension    Past Surgical History:  Procedure Laterality Date   COLONOSCOPY WITH PROPOFOL N/A 07/20/2022   Procedure: COLONOSCOPY WITH PROPOFOL;  Surgeon: Jonathon Bellows, MD;  Location: [facility] ENDOSCOPY;  Service: Gastroenterology;  Laterality: N/A;   FOOT SURGERY  1995   bone removed from left foot   Family History  Problem Relation Age of Onset   Hypertension Mother    Hypercholesterolemia Mother    Stroke Maternal Grandfather    Brain cancer Paternal Grandfather    Breast cancer Neg Hx    Colon cancer Neg Hx    Social History   Socioeconomic History   Marital status: Single    Spouse name: Not on file   Number of children: 0   Years of education: Not on file   Highest education level: Not on file  Occupational History   Not on file  Tobacco Use   Smoking status: Never   Smokeless tobacco: Never  Vaping Use   Vaping Use: Never used  Substance and Sexual Activity   Alcohol use: Yes    Alcohol/week: 0.0 standard drinks of alcohol    Comment: occasional   Drug use: No   Sexual activity: Not on file  Other Topics Concern   Not on file  Social History Narrative   Not on file   Social Determinants of Health   Financial Resource Strain: Not on file  Food Insecurity: Not on file  Transportation Needs: Not on file  Physical Activity: Not on file  Stress: Not on file  Social Connections:  Not on file     Review of Systems  Constitutional:  Negative for appetite change and unexpected weight change.  HENT:  Negative for congestion, sinus pressure and sore throat.   Eyes:  Negative for pain and visual disturbance.  Respiratory:  Negative for cough, chest tightness and shortness of breath.   Cardiovascular:  Negative for chest pain and palpitations.  Gastrointestinal:  Negative for abdominal pain, diarrhea, nausea and vomiting.  Genitourinary:  Negative for difficulty urinating and dysuria.  Musculoskeletal:  Negative for joint swelling and myalgias.  Skin:  Negative for color change and rash.  Neurological:  Negative for dizziness and headaches.  Hematological:  Negative for adenopathy. Does not bruise/bleed easily.  Psychiatric/Behavioral:  Negative for agitation and dysphoric mood.        Objective:     BP 122/70   Pulse 90   Temp 98.3 F (36.8 C)   Resp 16   Ht '5\' 2"'$  (1.575 m)   Wt 135 lb 12.8 oz (61.6 kg)   SpO2 98%   BMI 24.84 kg/m  Wt Readings from Last 3 Encounters:  11/19/22 135 lb 12.8 oz (61.6 kg)  10/07/22 142 lb 6.4 oz (64.6 kg)  07/20/22 139 lb (63 kg)    Physical Exam Vitals  reviewed.  Constitutional:      General: She is not in acute distress.    Appearance: Normal appearance. She is well-developed.  HENT:     Head: Normocephalic and atraumatic.     Right Ear: External ear normal.     Left Ear: External ear normal.  Eyes:     General: No scleral icterus.       Right eye: No discharge.        Left eye: No discharge.     Conjunctiva/sclera: Conjunctivae normal.  Neck:     Thyroid: No thyromegaly.  Cardiovascular:     Rate and Rhythm: Normal rate and regular rhythm.  Pulmonary:     Effort: No tachypnea, accessory muscle usage or respiratory distress.     Breath sounds: Normal breath sounds. No decreased breath sounds or wheezing.  Chest:  Breasts:    Right: No inverted nipple, mass, nipple discharge or tenderness (no axillary  adenopathy).     Left: No inverted nipple, mass, nipple discharge or tenderness (no axilarry adenopathy).  Abdominal:     General: Bowel sounds are normal.     Palpations: Abdomen is soft.     Tenderness: There is no abdominal tenderness.  Genitourinary:    Comments: Normal external genitalia.  Vaginal vault without lesions.  Cervix identified.  Pap smear performed.  Could not appreciate any adnexal masses or tenderness.   Musculoskeletal:        General: No swelling or tenderness.     Cervical back: Neck supple.  Lymphadenopathy:     Cervical: No cervical adenopathy.  Skin:    Findings: No erythema or rash.  Neurological:     Mental Status: She is alert and oriented to person, place, and time.  Psychiatric:        Mood and Affect: Mood normal.        Behavior: Behavior normal.      Outpatient Encounter Medications as of 11/19/2022  Medication Sig   amLODipine (NORVASC) 2.5 MG tablet Take 1 tablet (2.5 mg total) by mouth daily.   ibuprofen (ADVIL,MOTRIN) 200 MG tablet Take 200 mg by mouth every 6 (six) hours as needed.   norethindrone (MICRONOR) 0.35 MG tablet Take 1 tablet (0.35 mg total) by mouth daily.   No facility-administered encounter medications on file as of 11/19/2022.     Lab Results  Component Value Date   WBC 6.4 11/25/2022   HGB 14.3 11/25/2022   HCT 41.4 11/25/2022   PLT 318 11/25/2022   GLUCOSE 91 11/25/2022   CHOL 176 11/25/2022   TRIG 99 11/25/2022   HDL 45 11/25/2022   LDLCALC 113 (H) 11/25/2022   ALT 12 11/25/2022   AST 10 11/25/2022   NA 139 11/25/2022   K 4.0 11/25/2022   CL 104 11/25/2022   CREATININE 0.79 11/25/2022   BUN 15 11/25/2022   CO2 26 11/07/2015   TSH 2.120 11/25/2022    No results found.     Assessment & Plan:  Routine general medical examination at a health care facility  Health care maintenance Assessment & Plan: Physical today 11/19/22.  PAP 09/02/20 - negative with negative HPV.  F/u pap 11/19/22. Mammogram 11/28/21 -  Birads I.  F/u mammogram scheduled for 11/30/22. Colonoscopy 07/20/22 - recommended f/u colonoscopy in 10 years.     Screening for cervical cancer -     Cytology - PAP  Essential hypertension Assessment & Plan: On low dose amlodipine. On micronor now.  Follow pressures. Continue amlodipine.  Orders: -     Basic metabolic panel -     CBC with Differential/Platelet -     TSH  Hyperlipidemia, unspecified hyperlipidemia type -     Hepatic function panel -     Lipid panel  OCP (oral contraceptive pills) initiation Assessment & Plan: On micronor now.  Tolerating.     Stress Assessment & Plan: Overall appears to be handling things relatively well.  Follow.    Thyroid fullness Assessment & Plan: Saw Dr Gabriel Carina.  Had f/u ultrasound.  Stable.  Note reviewed.  No further w/up warranted.        Einar Pheasant, MD

## 2022-11-25 ENCOUNTER — Other Ambulatory Visit: Payer: Self-pay

## 2022-11-25 DIAGNOSIS — Z0000 Encounter for general adult medical examination without abnormal findings: Secondary | ICD-10-CM

## 2022-11-25 LAB — CYTOLOGY - PAP
Comment: NEGATIVE
Diagnosis: NEGATIVE
High risk HPV: NEGATIVE

## 2022-11-25 NOTE — Progress Notes (Signed)
Presents to [facility] for labs for annual physical.  States she already had her physical with Dr. Einar Pheasant, but forgot to do her labs beforehand.  Here today for EX1 panel that [employer] employees get annually for free.  AMD

## 2022-11-26 LAB — CMP12+LP+TP+TSH+6AC+CBC/D/PLT
ALT: 12 IU/L (ref 0–32)
AST: 10 IU/L (ref 0–40)
Albumin/Globulin Ratio: 2.2 (ref 1.2–2.2)
Albumin: 4.4 g/dL (ref 3.9–4.9)
Alkaline Phosphatase: 71 IU/L (ref 44–121)
BUN/Creatinine Ratio: 19 (ref 9–23)
BUN: 15 mg/dL (ref 6–24)
Basophils Absolute: 0.1 10*3/uL (ref 0.0–0.2)
Basos: 1 %
Bilirubin Total: 0.4 mg/dL (ref 0.0–1.2)
Calcium: 9.1 mg/dL (ref 8.7–10.2)
Chloride: 104 mmol/L (ref 96–106)
Chol/HDL Ratio: 3.9 ratio (ref 0.0–4.4)
Cholesterol, Total: 176 mg/dL (ref 100–199)
Creatinine, Ser: 0.79 mg/dL (ref 0.57–1.00)
EOS (ABSOLUTE): 0.1 10*3/uL (ref 0.0–0.4)
Eos: 1 %
Estimated CHD Risk: 0.8 times avg. (ref 0.0–1.0)
Free Thyroxine Index: 2.1 (ref 1.2–4.9)
GGT: 20 IU/L (ref 0–60)
Globulin, Total: 2 g/dL (ref 1.5–4.5)
Glucose: 91 mg/dL (ref 70–99)
HDL: 45 mg/dL (ref >39)
Hematocrit: 41.4 % (ref 34.0–46.6)
Hemoglobin: 14.3 g/dL (ref 11.1–15.9)
Immature Grans (Abs): 0 10*3/uL (ref 0.0–0.1)
Immature Granulocytes: 0 %
Iron: 151 ug/dL (ref 27–159)
LDH: 136 IU/L (ref 119–226)
LDL Chol Calc (NIH): 113 mg/dL — ABNORMAL HIGH (ref 0–99)
Lymphocytes Absolute: 2.2 10*3/uL (ref 0.7–3.1)
Lymphs: 34 %
MCH: 30.8 pg (ref 26.6–33.0)
MCHC: 34.5 g/dL (ref 31.5–35.7)
MCV: 89 fL (ref 79–97)
Monocytes Absolute: 0.5 10*3/uL (ref 0.1–0.9)
Monocytes: 8 %
Neutrophils Absolute: 3.6 10*3/uL (ref 1.4–7.0)
Neutrophils: 56 %
Phosphorus: 3 mg/dL (ref 3.0–4.3)
Platelets: 318 10*3/uL (ref 150–450)
Potassium: 4 mmol/L (ref 3.5–5.2)
RBC: 4.64 x10E6/uL (ref 3.77–5.28)
RDW: 12.7 % (ref 11.7–15.4)
Sodium: 139 mmol/L (ref 134–144)
T3 Uptake Ratio: 27 % (ref 24–39)
T4, Total: 7.7 ug/dL (ref 4.5–12.0)
TSH: 2.12 u[IU]/mL (ref 0.450–4.500)
Total Protein: 6.4 g/dL (ref 6.0–8.5)
Triglycerides: 99 mg/dL (ref 0–149)
Uric Acid: 4.7 mg/dL (ref 2.6–6.2)
VLDL Cholesterol Cal: 18 mg/dL (ref 5–40)
WBC: 6.4 10*3/uL (ref 3.4–10.8)
eGFR: 94 mL/min/{1.73_m2} (ref >59)

## 2022-11-29 ENCOUNTER — Encounter: Payer: Self-pay | Admitting: Internal Medicine

## 2022-11-29 NOTE — Assessment & Plan Note (Signed)
Overall appears to be handling things relatively well.  Follow.   

## 2022-11-29 NOTE — Assessment & Plan Note (Signed)
On low dose amlodipine. On micronor now.  Follow pressures. Continue amlodipine.

## 2022-11-29 NOTE — Assessment & Plan Note (Signed)
On micronor now.  Tolerating.

## 2022-11-29 NOTE — Assessment & Plan Note (Signed)
Saw Dr Gabriel Carina.  Had f/u ultrasound.  Stable.  Note reviewed.  No further w/up warranted.

## 2022-11-30 ENCOUNTER — Ambulatory Visit
Admission: RE | Admit: 2022-11-30 | Discharge: 2022-11-30 | Disposition: A | Discharge status: Home / Self Care | Payer: 59 | Source: Ambulatory Visit | Attending: Internal Medicine | Admitting: Internal Medicine

## 2022-11-30 DIAGNOSIS — Z1231 Encounter for screening mammogram for malignant neoplasm of breast: Secondary | ICD-10-CM | POA: Diagnosis not present

## 2022-12-02 ENCOUNTER — Other Ambulatory Visit: Payer: Self-pay | Admitting: Internal Medicine

## 2022-12-02 DIAGNOSIS — R928 Other abnormal and inconclusive findings on diagnostic imaging of breast: Secondary | ICD-10-CM

## 2022-12-02 DIAGNOSIS — N6489 Other specified disorders of breast: Secondary | ICD-10-CM

## 2022-12-09 ENCOUNTER — Ambulatory Visit
Admission: RE | Admit: 2022-12-09 | Discharge: 2022-12-09 | Disposition: A | Discharge status: Home / Self Care | Payer: 59 | Source: Ambulatory Visit | Attending: Internal Medicine | Admitting: Internal Medicine

## 2022-12-09 DIAGNOSIS — N6489 Other specified disorders of breast: Secondary | ICD-10-CM

## 2022-12-09 DIAGNOSIS — R928 Other abnormal and inconclusive findings on diagnostic imaging of breast: Secondary | ICD-10-CM

## 2023-01-08 ENCOUNTER — Encounter: Payer: Self-pay | Admitting: Internal Medicine

## 2023-01-08 NOTE — Telephone Encounter (Signed)
Sometimes it can take a bit to regulate, but since she just ended period 01/01/23 - have her keep record of bleeding.  I assume no bleeding now - since last period. Call with update.

## 2023-01-11 NOTE — Telephone Encounter (Signed)
This may be that she is on a progesterone only pill.  We had discussed trying this given her history of hypertension - to avoid estrogen.  Given that she is continuing to bleed, I would like to have gyn evaluate.  Will probably need to change, but also want to confirm nothing more contributing to the bleeding.  If agreeable, see if she has a preference of which gyn she would prefer to see.

## 2023-01-12 NOTE — Telephone Encounter (Signed)
FYI   Per patient- bleeding has stopped and she would like to hold on gyn referral right now to see if maybe it just took her body a little longer to regulate. She will keep [place] posted.

## 2023-03-03 DIAGNOSIS — Z6824 Body mass index (BMI) 24.0-24.9, adult: Secondary | ICD-10-CM | POA: Diagnosis not present

## 2023-03-03 DIAGNOSIS — R350 Frequency of micturition: Secondary | ICD-10-CM | POA: Diagnosis not present

## 2023-03-03 DIAGNOSIS — R300 Dysuria: Secondary | ICD-10-CM | POA: Diagnosis not present

## 2023-03-17 DIAGNOSIS — Z6824 Body mass index (BMI) 24.0-24.9, adult: Secondary | ICD-10-CM | POA: Diagnosis not present

## 2023-03-17 DIAGNOSIS — N3000 Acute cystitis without hematuria: Secondary | ICD-10-CM | POA: Diagnosis not present

## 2023-03-17 DIAGNOSIS — R350 Frequency of micturition: Secondary | ICD-10-CM | POA: Diagnosis not present

## 2023-05-03 ENCOUNTER — Encounter: Payer: Self-pay | Admitting: Internal Medicine

## 2023-05-04 NOTE — Telephone Encounter (Signed)
Discussed with her regarding her birth control pills.  She is on micronor.  Given these are progesterone only pills, unable to change dosing to affect her period.  While on the phone, she did inform me that she was going on a cruise next week and will run out of her blood pressure medication prior to returning.  Please notify pharmacy - ok to refill early.  ([facility] [address] - I believe).

## 2023-05-05 ENCOUNTER — Encounter (INDEPENDENT_AMBULATORY_CARE_PROVIDER_SITE_OTHER): Payer: Self-pay

## 2023-05-05 ENCOUNTER — Other Ambulatory Visit: Payer: Self-pay | Admitting: Internal Medicine

## 2023-05-05 MED ORDER — AMLODIPINE BESYLATE 2.5 MG PO TABS: 2.5000 mg | ORAL_TABLET | Freq: Every day | ORAL | 3 refills | Status: DC

## 2023-05-05 NOTE — Telephone Encounter (Signed)
Spoke with pharmacy. Refill sent and authorization to fill early has been given

## 2023-05-20 ENCOUNTER — Ambulatory Visit (INDEPENDENT_AMBULATORY_CARE_PROVIDER_SITE_OTHER): Payer: 59 | Admitting: Internal Medicine

## 2023-05-20 ENCOUNTER — Encounter: Payer: Self-pay | Admitting: Internal Medicine

## 2023-05-20 VITALS — BP 120/72 | HR 88 | Temp 98.3°F | Resp 16 | Ht 62.0 in | Wt 121.0 lb

## 2023-05-20 DIAGNOSIS — F439 Reaction to severe stress, unspecified: Secondary | ICD-10-CM

## 2023-05-20 DIAGNOSIS — E0789 Other specified disorders of thyroid: Secondary | ICD-10-CM | POA: Diagnosis not present

## 2023-05-20 DIAGNOSIS — Z30011 Encounter for initial prescription of contraceptive pills: Secondary | ICD-10-CM

## 2023-05-20 DIAGNOSIS — I10 Essential (primary) hypertension: Secondary | ICD-10-CM

## 2023-05-20 NOTE — Assessment & Plan Note (Signed)
On micronor now.  Tolerating.  She had questions about IUD.  Will call me if desires referral to discuss.

## 2023-05-20 NOTE — Assessment & Plan Note (Signed)
Overall appears to be handling things relatively well.  Follow.   

## 2023-05-20 NOTE — Assessment & Plan Note (Signed)
On low dose amlodipine. On micronor now.  Follow pressures. Continue amlodipine.

## 2023-05-20 NOTE — Assessment & Plan Note (Signed)
Saw Dr Gabriel Carina.  Had f/u ultrasound.  Stable.  Note reviewed.  No further w/up warranted.

## 2023-05-20 NOTE — Progress Notes (Signed)
Subjective:    Patient ID: Stephanie Graves, female    DOB: 1976/04/04, 47 y.o.   MRN: [id number]  Patient here for  Chief Complaint  Patient presents with   Medical Management of Chronic Issues    HPI Here for follow up - f/u regarding hypertension and hypercholesterolemia.  On amlodipine.  Outside blood pressures averaging 114/128/60-80s.  No headache or dizziness.  No chest pain or sob reported.  No abdominal pain or bowel change.  Has lost weight.  Exercising - walking regularly.  Overall feels good.    Past Medical History:  Diagnosis Date   Chicken pox as a child   Hypertension    Past Surgical History:  Procedure Laterality Date   COLONOSCOPY WITH PROPOFOL N/A 07/20/2022   Procedure: COLONOSCOPY WITH PROPOFOL;  Surgeon: Wyline Mood, MD;  Location: [facility] ENDOSCOPY;  Service: Gastroenterology;  Laterality: N/A;   FOOT SURGERY  1995   bone removed from left foot   Family History  Problem Relation Age of Onset   Hypertension Mother    Hypercholesterolemia Mother    Stroke Maternal Grandfather    Brain cancer Paternal Grandfather    Breast cancer Neg Hx    Colon cancer Neg Hx    Social History   Socioeconomic History   Marital status: Single    Spouse name: Not on file   Number of children: 0   Years of education: Not on file   Highest education level: Not on file  Occupational History   Not on file  Tobacco Use   Smoking status: Never   Smokeless tobacco: Never  Vaping Use   Vaping status: Never Used  Substance and Sexual Activity   Alcohol use: Yes    Alcohol/week: 0.0 standard drinks of alcohol    Comment: occasional   Drug use: No   Sexual activity: Not on file  Other Topics Concern   Not on file  Social History Narrative   Not on file   Social Determinants of Health   Financial Resource Strain: Not on file  Food Insecurity: Not on file  Transportation Needs: Not on file  Physical Activity: Not on file  Stress: Not on file  Social Connections:  Not on file     Review of Systems  Constitutional:  Negative for appetite change and unexpected weight change.  HENT:  Negative for congestion and sinus pressure.   Respiratory:  Negative for cough, chest tightness and shortness of breath.   Cardiovascular:  Negative for chest pain, palpitations and leg swelling.  Gastrointestinal:  Negative for abdominal pain, diarrhea, nausea and vomiting.  Genitourinary:  Negative for difficulty urinating and dysuria.  Musculoskeletal:  Negative for joint swelling and myalgias.  Skin:  Negative for color change and rash.  Neurological:  Negative for dizziness and headaches.  Psychiatric/Behavioral:  Negative for agitation and dysphoric mood.        Objective:     BP 120/72   Pulse 88   Temp 98.3 F (36.8 C)   Resp 16   Ht 5\' 2"  (1.575 m)   Wt 121 lb (54.9 kg)   SpO2 99%   BMI 22.13 kg/m  Wt Readings from Last 3 Encounters:  05/20/23 121 lb (54.9 kg)  11/19/22 135 lb 12.8 oz (61.6 kg)  10/07/22 142 lb 6.4 oz (64.6 kg)    Physical Exam Vitals reviewed.  Constitutional:      General: She is not in acute distress.    Appearance: Normal appearance.  HENT:  Head: Normocephalic and atraumatic.     Right Ear: External ear normal.     Left Ear: External ear normal.  Eyes:     General: No scleral icterus.       Right eye: No discharge.        Left eye: No discharge.     Conjunctiva/sclera: Conjunctivae normal.  Neck:     Thyroid: No thyromegaly.  Cardiovascular:     Rate and Rhythm: Normal rate and regular rhythm.  Pulmonary:     Effort: No respiratory distress.     Breath sounds: Normal breath sounds. No wheezing.  Abdominal:     General: Bowel sounds are normal.     Palpations: Abdomen is soft.     Tenderness: There is no abdominal tenderness.  Musculoskeletal:        General: No swelling or tenderness.     Cervical back: Neck supple. No tenderness.  Lymphadenopathy:     Cervical: No cervical adenopathy.  Skin:     Findings: No erythema or rash.  Neurological:     Mental Status: She is alert.  Psychiatric:        Mood and Affect: Mood normal.        Behavior: Behavior normal.      Outpatient Encounter Medications as of 05/20/2023  Medication Sig   amLODipine (NORVASC) 2.5 MG tablet Take 1 tablet (2.5 mg total) by mouth daily.   ibuprofen (ADVIL,MOTRIN) 200 MG tablet Take 200 mg by mouth every 6 (six) hours as needed.   norethindrone (MICRONOR) 0.35 MG tablet Take 1 tablet (0.35 mg total) by mouth daily.   No facility-administered encounter medications on file as of 05/20/2023.     Lab Results  Component Value Date   WBC 6.4 11/25/2022   HGB 14.3 11/25/2022   HCT 41.4 11/25/2022   PLT 318 11/25/2022   GLUCOSE 91 11/25/2022   CHOL 176 11/25/2022   TRIG 99 11/25/2022   HDL 45 11/25/2022   LDLCALC 113 (H) 11/25/2022   ALT 12 11/25/2022   AST 10 11/25/2022   NA 139 11/25/2022   K 4.0 11/25/2022   CL 104 11/25/2022   CREATININE 0.79 11/25/2022   BUN 15 11/25/2022   CO2 26 11/07/2015   TSH 2.120 11/25/2022    MM DIAG BREAST TOMO UNI RIGHT  Result Date: 12/09/2022 CLINICAL DATA:  Callback for RIGHT breast asymmetries EXAM: DIGITAL DIAGNOSTIC UNILATERAL RIGHT MAMMOGRAM WITH TOMOSYNTHESIS; ULTRASOUND RIGHT BREAST LIMITED TECHNIQUE: Right digital diagnostic mammography and breast tomosynthesis was performed.; Targeted ultrasound examination of the right breast was performed COMPARISON:  Previous exam(s). ACR Breast Density Category c: The breasts are heterogeneously dense, which may obscure small masses. FINDINGS: The previously described finding seen on CC view does not persist with additional views, consistent with superimposed fibroglandular tissue. Spot compression tomosynthesis views confirm persistence of an oval circumscribed mass in the RIGHT outer breast. On physical exam, no suspicious mass appreciated. Targeted ultrasound was performed the RIGHT outer breast. At 9 o'clock 8 cm from the  nipple, there is an oval circumscribed anechoic mass with posterior acoustic enhancement a thin internal septation. It measures 9 x 5 x 10 mm and is consistent with a benign cluster of cysts. At 9 o'clock 6 cm from the nipple, there is an oval circumscribed anechoic mass with posterior acoustic enhancement and a thin internal septation. It measures 10 x 4 x 7 mm and is consistent with a benign cluster of cysts. IMPRESSION: There are benign cysts at the  site of screening mammographic concern. RECOMMENDATION: Screening mammogram in one year.(Code:SM-B-01Y) I have discussed the findings and recommendations with the patient. If applicable, a reminder letter will be sent to the patient regarding the next appointment. BI-RADS CATEGORY  2: Benign. Electronically Signed   By: Meda Klinefelter M.D.   On: 12/09/2022 16:05  US BREAST LTD UNI RIGHT INC AXILLA  Result Date: 12/09/2022 CLINICAL DATA:  Callback for RIGHT breast asymmetries EXAM: DIGITAL DIAGNOSTIC UNILATERAL RIGHT MAMMOGRAM WITH TOMOSYNTHESIS; ULTRASOUND RIGHT BREAST LIMITED TECHNIQUE: Right digital diagnostic mammography and breast tomosynthesis was performed.; Targeted ultrasound examination of the right breast was performed COMPARISON:  Previous exam(s). ACR Breast Density Category c: The breasts are heterogeneously dense, which may obscure small masses. FINDINGS: The previously described finding seen on CC view does not persist with additional views, consistent with superimposed fibroglandular tissue. Spot compression tomosynthesis views confirm persistence of an oval circumscribed mass in the RIGHT outer breast. On physical exam, no suspicious mass appreciated. Targeted ultrasound was performed the RIGHT outer breast. At 9 o'clock 8 cm from the nipple, there is an oval circumscribed anechoic mass with posterior acoustic enhancement a thin internal septation. It measures 9 x 5 x 10 mm and is consistent with a benign cluster of cysts. At 9 o'clock 6 cm  from the nipple, there is an oval circumscribed anechoic mass with posterior acoustic enhancement and a thin internal septation. It measures 10 x 4 x 7 mm and is consistent with a benign cluster of cysts. IMPRESSION: There are benign cysts at the site of screening mammographic concern. RECOMMENDATION: Screening mammogram in one year.(Code:SM-B-01Y) I have discussed the findings and recommendations with the patient. If applicable, a reminder letter will be sent to the patient regarding the next appointment. BI-RADS CATEGORY  2: Benign. Electronically Signed   By: Meda Klinefelter M.D.   On: 12/09/2022 16:05      Assessment & Plan:  Essential hypertension Assessment & Plan: On low dose amlodipine. On micronor now.  Follow pressures. Continue amlodipine.     OCP (oral contraceptive pills) initiation Assessment & Plan: On micronor now.  Tolerating.  She had questions about IUD.  Will call me if desires referral to discuss.    Stress Assessment & Plan: Overall appears to be handling things relatively well.  Follow.    Thyroid fullness Assessment & Plan: Saw Dr Tedd Sias.  Had f/u ultrasound.  Stable.  Note reviewed.  No further w/up warranted.        Dale [place], MD

## 2023-09-05 ENCOUNTER — Other Ambulatory Visit: Payer: Self-pay | Admitting: Internal Medicine

## 2023-10-18 ENCOUNTER — Other Ambulatory Visit: Payer: Self-pay | Admitting: Internal Medicine

## 2023-10-18 DIAGNOSIS — Z1231 Encounter for screening mammogram for malignant neoplasm of breast: Secondary | ICD-10-CM

## 2023-11-23 ENCOUNTER — Ambulatory Visit (INDEPENDENT_AMBULATORY_CARE_PROVIDER_SITE_OTHER): Payer: 59 | Admitting: Internal Medicine

## 2023-11-23 VITALS — BP 118/74 | HR 97 | Temp 98.0°F | Resp 16 | Ht 62.0 in | Wt 118.0 lb

## 2023-11-23 DIAGNOSIS — Z3009 Encounter for other general counseling and advice on contraception: Secondary | ICD-10-CM | POA: Diagnosis not present

## 2023-11-23 DIAGNOSIS — I10 Essential (primary) hypertension: Secondary | ICD-10-CM

## 2023-11-23 DIAGNOSIS — Z0000 Encounter for general adult medical examination without abnormal findings: Secondary | ICD-10-CM

## 2023-11-23 DIAGNOSIS — E785 Hyperlipidemia, unspecified: Secondary | ICD-10-CM | POA: Diagnosis not present

## 2023-11-23 DIAGNOSIS — F439 Reaction to severe stress, unspecified: Secondary | ICD-10-CM | POA: Diagnosis not present

## 2023-11-23 MED ORDER — NORETHINDRONE 0.35 MG PO TABS: 1.0000 | ORAL_TABLET | Freq: Every day | ORAL | 3 refills | Status: AC

## 2023-11-23 NOTE — Assessment & Plan Note (Signed)
 Physical today 11/23/23.  PAP 09/02/20 - negative with negative HPV.  F/u pap 11/19/22 - negative with negative HPV. Mammogram 11/30/22 - Birads 0. F/u right breast mammogram and ultrasound - birads II.  Recommended f/u in one year.  Mammogram scheduled. Colonoscopy 07/20/22 - recommended f/u colonoscopy in 10 years.

## 2023-11-23 NOTE — Progress Notes (Signed)
 Subjective:    Patient ID: Stephanie Graves, female    DOB: 1976/02/19, 48 y.o.   MRN: [id number]  Patient here for  Chief Complaint  Patient presents with   Annual Exam    HPI Here for a physical exam.  Remains on amlodipine for her blood pressure. Blood pressure has been doing well. Increased stress - work. Discussed. Having some increased spotting/bleeding. On micronor. Previously elected to use progesterone only - due to hypertension. She is interested in discussig with gyn regarding other birth control options.    Past Medical History:  Diagnosis Date   Chicken pox as a child   Hypertension    Past Surgical History:  Procedure Laterality Date   COLONOSCOPY WITH PROPOFOL N/A 07/20/2022   Procedure: COLONOSCOPY WITH PROPOFOL;  Surgeon: Wyline Mood, MD;  Location: [facility] ENDOSCOPY;  Service: Gastroenterology;  Laterality: N/A;   FOOT SURGERY  1995   bone removed from left foot   Family History  Problem Relation Age of Onset   Hypertension Mother    Hypercholesterolemia Mother    Stroke Maternal Grandfather    Brain cancer Paternal Grandfather    Breast cancer Neg Hx    Colon cancer Neg Hx    Social History   Socioeconomic History   Marital status: Single    Spouse name: Not on file   Number of children: 0   Years of education: Not on file   Highest education level: Bachelor's degree (e.g., BA, AB, BS)  Occupational History   Not on file  Tobacco Use   Smoking status: Never   Smokeless tobacco: Never  Vaping Use   Vaping status: Never Used  Substance and Sexual Activity   Alcohol use: Yes    Alcohol/week: 0.0 standard drinks of alcohol    Comment: occasional   Drug use: No   Sexual activity: Not on file  Other Topics Concern   Not on file  Social History Narrative   Not on file   Social Drivers of Health   Financial Resource Strain: Low Risk  (11/18/2023)   Overall Financial Resource Strain (CARDIA)    Difficulty of Paying Living Expenses: Not very hard   Food Insecurity: No Food Insecurity (11/18/2023)   Hunger Vital Sign    Worried About Running Out of Food in the Last Year: Never true    Ran Out of Food in the Last Year: Never true  Transportation Needs: No Transportation Needs (11/18/2023)   PRAPARE - Administrator, Civil Service (Medical): No    Lack of Transportation (Non-Medical): No  Physical Activity: Sufficiently Active (11/18/2023)   Exercise Vital Sign    Days of Exercise per Week: 5 days    Minutes of Exercise per Session: 60 min  Stress: Stress Concern Present (11/18/2023)   [employer] of Occupational Health - Occupational Stress Questionnaire    Feeling of Stress : To some extent  Social Connections: Moderately Isolated (11/18/2023)   Social Connection and Isolation Panel [NHANES]    Frequency of Communication with Friends and Family: More than three times a week    Frequency of Social Gatherings with Friends and Family: Once a week    Attends Religious Services: Never    Database administrator or Organizations: Yes    Attends Banker Meetings: Never    Marital Status: Never married     Review of Systems  Constitutional:  Negative for fatigue and unexpected weight change.  HENT:  Negative for congestion, sinus  pressure and sore throat.   Eyes:  Negative for pain and visual disturbance.  Respiratory:  Negative for cough, chest tightness and shortness of breath.   Cardiovascular:  Negative for chest pain, palpitations and leg swelling.  Gastrointestinal:  Negative for abdominal pain, diarrhea, nausea and vomiting.  Genitourinary:  Negative for difficulty urinating and dysuria.  Musculoskeletal:  Negative for joint swelling and myalgias.  Skin:  Negative for color change and rash.  Neurological:  Negative for dizziness and headaches.  Hematological:  Negative for adenopathy. Does not bruise/bleed easily.  Psychiatric/Behavioral:  Negative for agitation and dysphoric mood.         Objective:     BP 118/74   Pulse 97   Temp 98 F (36.7 C)   Resp 16   Ht 5\' 2"  (1.575 m)   Wt 118 lb (53.5 kg)   SpO2 98%   BMI 21.58 kg/m  Wt Readings from Last 3 Encounters:  11/23/23 118 lb (53.5 kg)  05/20/23 121 lb (54.9 kg)  11/19/22 135 lb 12.8 oz (61.6 kg)    Physical Exam Vitals reviewed.  Constitutional:      General: She is not in acute distress.    Appearance: Normal appearance. She is well-developed.  HENT:     Head: Normocephalic and atraumatic.     Right Ear: External ear normal.     Left Ear: External ear normal.     Mouth/Throat:     Pharynx: No oropharyngeal exudate or posterior oropharyngeal erythema.  Eyes:     General: No scleral icterus.       Right eye: No discharge.        Left eye: No discharge.     Conjunctiva/sclera: Conjunctivae normal.  Neck:     Thyroid: No thyromegaly.  Cardiovascular:     Rate and Rhythm: Normal rate and regular rhythm.  Pulmonary:     Effort: No tachypnea, accessory muscle usage or respiratory distress.     Breath sounds: Normal breath sounds. No decreased breath sounds or wheezing.  Chest:  Breasts:    Right: No inverted nipple, mass, nipple discharge or tenderness (no axillary adenopathy).     Left: No inverted nipple, mass, nipple discharge or tenderness (no axilarry adenopathy).  Abdominal:     General: Bowel sounds are normal.     Palpations: Abdomen is soft.     Tenderness: There is no abdominal tenderness.  Musculoskeletal:        General: No swelling or tenderness.     Cervical back: Neck supple.  Lymphadenopathy:     Cervical: No cervical adenopathy.  Skin:    Findings: No erythema or rash.  Neurological:     Mental Status: She is alert and oriented to person, place, and time.  Psychiatric:        Mood and Affect: Mood normal.        Behavior: Behavior normal.         Outpatient Encounter Medications as of 11/23/2023  Medication Sig   amLODipine (NORVASC) 2.5 MG tablet Take 1 tablet (2.5  mg total) by mouth daily.   ibuprofen (ADVIL,MOTRIN) 200 MG tablet Take 200 mg by mouth every 6 (six) hours as needed.   norethindrone (MICRONOR) 0.35 MG tablet Take 1 tablet (0.35 mg total) by mouth daily.   [DISCONTINUED] norethindrone (MICRONOR) 0.35 MG tablet TAKE 1 TABLET BY MOUTH EVERY DAY   No facility-administered encounter medications on file as of 11/23/2023.     Lab Results  Component Value  Date   WBC 4.8 11/26/2023   HGB 13.7 11/26/2023   HCT 40.2 11/26/2023   PLT 308 11/26/2023   GLUCOSE 86 11/26/2023   CHOL 176 11/26/2023   TRIG 92 11/26/2023   HDL 50 11/26/2023   LDLCALC 109 (H) 11/26/2023   ALT 8 11/26/2023   AST 10 11/26/2023   NA 138 11/26/2023   K 4.2 11/26/2023   CL 103 11/26/2023   CREATININE 0.80 11/26/2023   BUN 14 11/26/2023   CO2 26 11/07/2015   TSH 2.700 11/26/2023    MM DIAG BREAST TOMO UNI RIGHT Result Date: 12/09/2022 CLINICAL DATA:  Callback for RIGHT breast asymmetries EXAM: DIGITAL DIAGNOSTIC UNILATERAL RIGHT MAMMOGRAM WITH TOMOSYNTHESIS; ULTRASOUND RIGHT BREAST LIMITED TECHNIQUE: Right digital diagnostic mammography and breast tomosynthesis was performed.; Targeted ultrasound examination of the right breast was performed COMPARISON:  Previous exam(s). ACR Breast Density Category c: The breasts are heterogeneously dense, which may obscure small masses. FINDINGS: The previously described finding seen on CC view does not persist with additional views, consistent with superimposed fibroglandular tissue. Spot compression tomosynthesis views confirm persistence of an oval circumscribed mass in the RIGHT outer breast. On physical exam, no suspicious mass appreciated. Targeted ultrasound was performed the RIGHT outer breast. At 9 o'clock 8 cm from the nipple, there is an oval circumscribed anechoic mass with posterior acoustic enhancement a thin internal septation. It measures 9 x 5 x 10 mm and is consistent with a benign cluster of cysts. At 9 o'clock 6 cm from  the nipple, there is an oval circumscribed anechoic mass with posterior acoustic enhancement and a thin internal septation. It measures 10 x 4 x 7 mm and is consistent with a benign cluster of cysts. IMPRESSION: There are benign cysts at the site of screening mammographic concern. RECOMMENDATION: Screening mammogram in one year.(Code:SM-B-01Y) I have discussed the findings and recommendations with the patient. If applicable, a reminder letter will be sent to the patient regarding the next appointment. BI-RADS CATEGORY  2: Benign. Electronically Signed   By: Meda Klinefelter M.D.   On: 12/09/2022 16:05  US BREAST LTD UNI RIGHT INC AXILLA Result Date: 12/09/2022 CLINICAL DATA:  Callback for RIGHT breast asymmetries EXAM: DIGITAL DIAGNOSTIC UNILATERAL RIGHT MAMMOGRAM WITH TOMOSYNTHESIS; ULTRASOUND RIGHT BREAST LIMITED TECHNIQUE: Right digital diagnostic mammography and breast tomosynthesis was performed.; Targeted ultrasound examination of the right breast was performed COMPARISON:  Previous exam(s). ACR Breast Density Category c: The breasts are heterogeneously dense, which may obscure small masses. FINDINGS: The previously described finding seen on CC view does not persist with additional views, consistent with superimposed fibroglandular tissue. Spot compression tomosynthesis views confirm persistence of an oval circumscribed mass in the RIGHT outer breast. On physical exam, no suspicious mass appreciated. Targeted ultrasound was performed the RIGHT outer breast. At 9 o'clock 8 cm from the nipple, there is an oval circumscribed anechoic mass with posterior acoustic enhancement a thin internal septation. It measures 9 x 5 x 10 mm and is consistent with a benign cluster of cysts. At 9 o'clock 6 cm from the nipple, there is an oval circumscribed anechoic mass with posterior acoustic enhancement and a thin internal septation. It measures 10 x 4 x 7 mm and is consistent with a benign cluster of cysts. IMPRESSION:  There are benign cysts at the site of screening mammographic concern. RECOMMENDATION: Screening mammogram in one year.(Code:SM-B-01Y) I have discussed the findings and recommendations with the patient. If applicable, a reminder letter will be sent to the patient regarding the  next appointment. BI-RADS CATEGORY  2: Benign. Electronically Signed   By: Meda Klinefelter M.D.   On: 12/09/2022 16:05      Assessment & Plan:  Health care maintenance Assessment & Plan: Physical today 11/23/23.  PAP 09/02/20 - negative with negative HPV.  F/u pap 11/19/22 - negative with negative HPV. Mammogram 11/30/22 - Birads 0. F/u right breast mammogram and ultrasound - birads II.  Recommended f/u in one year.  Mammogram scheduled. Colonoscopy 07/20/22 - recommended f/u colonoscopy in 10 years.     Essential hypertension Assessment & Plan: Continue amlodipine. No changes. Blood pressure as outlined. Follow pressures. Follow metabolic panel.     Orders: -     TSH -     Basic metabolic panel -     CBC with Differential/Platelet  Hyperlipidemia, unspecified hyperlipidemia type -     Lipid panel -     Hepatic function panel  Birth control counseling Assessment & Plan: On micronor. Previously elected to use progesterone only - due to hypertension. She is interested in discussing with gyn regarding other birth control options.   Orders: -     Ambulatory referral to Gynecology  Stress Assessment & Plan: Increased stress. Discussed. Overall appears to be handling things well. Follow.    Other orders -     Norethindrone; Take 1 tablet (0.35 mg total) by mouth daily.  Dispense: 84 tablet; Refill: 3     Dale [place], MD

## 2023-11-26 ENCOUNTER — Encounter: Payer: Self-pay | Admitting: Internal Medicine

## 2023-11-26 ENCOUNTER — Other Ambulatory Visit: Payer: Self-pay

## 2023-11-26 DIAGNOSIS — Z0000 Encounter for general adult medical examination without abnormal findings: Secondary | ICD-10-CM

## 2023-11-26 NOTE — Progress Notes (Signed)
 Pt requested labs for outside physical. Stephanie Graves

## 2023-11-27 ENCOUNTER — Encounter: Payer: Self-pay | Admitting: Internal Medicine

## 2023-11-27 LAB — CMP12+LP+TP+TSH+6AC+CBC/D/PLT
ALT: 8 IU/L (ref 0–32)
AST: 10 IU/L (ref 0–40)
Albumin: 4.1 g/dL (ref 3.9–4.9)
Alkaline Phosphatase: 66 IU/L (ref 44–121)
BUN/Creatinine Ratio: 18 (ref 9–23)
BUN: 14 mg/dL (ref 6–24)
Basophils Absolute: 0.1 10*3/uL (ref 0.0–0.2)
Basos: 1 %
Bilirubin Total: 0.4 mg/dL (ref 0.0–1.2)
Calcium: 8.5 mg/dL — ABNORMAL LOW (ref 8.7–10.2)
Chloride: 103 mmol/L (ref 96–106)
Chol/HDL Ratio: 3.5 ratio (ref 0.0–4.4)
Cholesterol, Total: 176 mg/dL (ref 100–199)
Creatinine, Ser: 0.8 mg/dL (ref 0.57–1.00)
EOS (ABSOLUTE): 0.1 10*3/uL (ref 0.0–0.4)
Eos: 1 %
Estimated CHD Risk: 0.6 times avg. (ref 0.0–1.0)
Free Thyroxine Index: 1.9 (ref 1.2–4.9)
GGT: 16 IU/L (ref 0–60)
Globulin, Total: 2.2 g/dL (ref 1.5–4.5)
Glucose: 86 mg/dL (ref 70–99)
HDL: 50 mg/dL (ref >39)
Hematocrit: 40.2 % (ref 34.0–46.6)
Hemoglobin: 13.7 g/dL (ref 11.1–15.9)
Immature Grans (Abs): 0.1 10*3/uL (ref 0.0–0.1)
Immature Granulocytes: 2 %
Iron: 180 ug/dL — ABNORMAL HIGH (ref 27–159)
LDH: 141 IU/L (ref 119–226)
LDL Chol Calc (NIH): 109 mg/dL — ABNORMAL HIGH (ref 0–99)
Lymphocytes Absolute: 1.9 10*3/uL (ref 0.7–3.1)
Lymphs: 40 %
MCH: 31.1 pg (ref 26.6–33.0)
MCHC: 34.1 g/dL (ref 31.5–35.7)
MCV: 91 fL (ref 79–97)
Monocytes Absolute: 0.3 10*3/uL (ref 0.1–0.9)
Monocytes: 7 %
Neutrophils Absolute: 2.4 10*3/uL (ref 1.4–7.0)
Neutrophils: 49 %
Phosphorus: 3.1 mg/dL (ref 3.0–4.3)
Platelets: 308 10*3/uL (ref 150–450)
Potassium: 4.2 mmol/L (ref 3.5–5.2)
RBC: 4.41 x10E6/uL (ref 3.77–5.28)
RDW: 12.4 % (ref 11.7–15.4)
Sodium: 138 mmol/L (ref 134–144)
T3 Uptake Ratio: 26 % (ref 24–39)
T4, Total: 7.4 ug/dL (ref 4.5–12.0)
TSH: 2.7 u[IU]/mL (ref 0.450–4.500)
Total Protein: 6.3 g/dL (ref 6.0–8.5)
Triglycerides: 92 mg/dL (ref 0–149)
Uric Acid: 4.2 mg/dL (ref 2.6–6.2)
VLDL Cholesterol Cal: 17 mg/dL (ref 5–40)
WBC: 4.8 10*3/uL (ref 3.4–10.8)
eGFR: 92 mL/min/{1.73_m2} (ref >59)

## 2023-11-27 NOTE — Assessment & Plan Note (Signed)
Increased stress.  Discussed.  Overall appears to be handling things well. Follow.  

## 2023-11-27 NOTE — Assessment & Plan Note (Signed)
 Continue amlodipine. No changes. Blood pressure as outlined. Follow pressures. Follow metabolic panel.

## 2023-11-27 NOTE — Assessment & Plan Note (Signed)
 On micronor. Previously elected to use progesterone only - due to hypertension. She is interested in discussing with gyn regarding other birth control options.

## 2023-12-03 ENCOUNTER — Ambulatory Visit
Admission: RE | Admit: 2023-12-03 | Discharge: 2023-12-03 | Disposition: A | Discharge status: Home / Self Care | Payer: 59 | Source: Ambulatory Visit | Attending: Internal Medicine | Admitting: Internal Medicine

## 2023-12-03 DIAGNOSIS — Z1231 Encounter for screening mammogram for malignant neoplasm of breast: Secondary | ICD-10-CM | POA: Insufficient documentation

## 2023-12-08 ENCOUNTER — Other Ambulatory Visit: Payer: Self-pay | Admitting: Internal Medicine

## 2023-12-08 DIAGNOSIS — R928 Other abnormal and inconclusive findings on diagnostic imaging of breast: Secondary | ICD-10-CM

## 2023-12-08 NOTE — Progress Notes (Signed)
Order placed for f/u right breast mammogram and ultrasound  

## 2023-12-09 DIAGNOSIS — Z01411 Encounter for gynecological examination (general) (routine) with abnormal findings: Secondary | ICD-10-CM | POA: Diagnosis not present

## 2023-12-09 DIAGNOSIS — Z113 Encounter for screening for infections with a predominantly sexual mode of transmission: Secondary | ICD-10-CM | POA: Diagnosis not present

## 2023-12-10 ENCOUNTER — Ambulatory Visit
Admission: RE | Admit: 2023-12-10 | Discharge: 2023-12-10 | Disposition: A | Discharge status: Home / Self Care | Source: Ambulatory Visit | Attending: Internal Medicine | Admitting: Internal Medicine

## 2023-12-10 DIAGNOSIS — R928 Other abnormal and inconclusive findings on diagnostic imaging of breast: Secondary | ICD-10-CM | POA: Diagnosis not present

## 2023-12-10 DIAGNOSIS — R92333 Mammographic heterogeneous density, bilateral breasts: Secondary | ICD-10-CM | POA: Diagnosis not present

## 2023-12-10 DIAGNOSIS — N6312 Unspecified lump in the right breast, upper inner quadrant: Secondary | ICD-10-CM | POA: Diagnosis not present

## 2023-12-29 DIAGNOSIS — Z131 Encounter for screening for diabetes mellitus: Secondary | ICD-10-CM | POA: Diagnosis not present

## 2023-12-29 DIAGNOSIS — Z30430 Encounter for insertion of intrauterine contraceptive device: Secondary | ICD-10-CM | POA: Diagnosis not present

## 2023-12-29 DIAGNOSIS — I10 Essential (primary) hypertension: Secondary | ICD-10-CM | POA: Diagnosis not present

## 2023-12-29 DIAGNOSIS — E782 Mixed hyperlipidemia: Secondary | ICD-10-CM | POA: Insufficient documentation

## 2023-12-29 DIAGNOSIS — Z3200 Encounter for pregnancy test, result unknown: Secondary | ICD-10-CM | POA: Diagnosis not present

## 2023-12-29 DIAGNOSIS — R928 Other abnormal and inconclusive findings on diagnostic imaging of breast: Secondary | ICD-10-CM | POA: Diagnosis not present

## 2023-12-29 DIAGNOSIS — Z975 Presence of (intrauterine) contraceptive device: Secondary | ICD-10-CM | POA: Insufficient documentation

## 2023-12-29 DIAGNOSIS — Z01812 Encounter for preprocedural laboratory examination: Secondary | ICD-10-CM | POA: Diagnosis not present

## 2024-03-03 ENCOUNTER — Encounter: Payer: Self-pay | Admitting: Internal Medicine

## 2024-03-03 ENCOUNTER — Telehealth: Payer: Self-pay

## 2024-03-03 NOTE — Telephone Encounter (Signed)
 I left voicemail for patient asking her to please call us  to reschedule her 03/23/2024 appointment with Dr. Dellar Fenton, as Dr. Geralyn Knee will be out of the office.  I also sent a letter to patient via MyChart.  E2C2 - when patient calls back, please assist her with rescheduling this appointment.

## 2024-03-23 ENCOUNTER — Ambulatory Visit: Payer: 59 | Admitting: Internal Medicine

## 2024-05-06 ENCOUNTER — Other Ambulatory Visit: Payer: Self-pay | Admitting: Internal Medicine

## 2024-05-09 NOTE — Telephone Encounter (Signed)
 I refilled amlodipine  x 1. Needs f/u appt scheduled.

## 2024-08-08 ENCOUNTER — Other Ambulatory Visit: Payer: Self-pay | Admitting: Internal Medicine

## 2024-08-22 ENCOUNTER — Encounter: Payer: Self-pay | Admitting: Internal Medicine

## 2024-08-22 NOTE — Telephone Encounter (Signed)
 Noted

## 2024-10-18 ENCOUNTER — Other Ambulatory Visit: Payer: Self-pay | Admitting: Internal Medicine

## 2024-10-18 DIAGNOSIS — Z1231 Encounter for screening mammogram for malignant neoplasm of breast: Secondary | ICD-10-CM

## 2024-11-06 NOTE — Progress Notes (Signed)
 Stephanie Graves                                          MRN: [id number]   11/06/2024   The [facility] Quality Team Specialist reviewed this patient medical record for the purposes of chart review for care gap closure. The following were reviewed: abstraction for care gap closure-controlling blood pressure.    [facility] Quality Team

## 2024-11-09 ENCOUNTER — Other Ambulatory Visit: Payer: Self-pay | Admitting: Internal Medicine

## 2024-11-23 ENCOUNTER — Encounter: Admitting: Internal Medicine

## 2024-12-04 ENCOUNTER — Encounter
